# Patient Record
Sex: Female | Born: 1985 | Race: Black or African American | Hispanic: No | Marital: Married | Smoking: Current some day smoker
Health system: Southern US, Community
[De-identification: ages and names within clinical notes are randomized; demographics above are authoritative.]

## PROBLEM LIST (undated history)

## (undated) DIAGNOSIS — R87629 Unspecified abnormal cytological findings in specimens from vagina: Secondary | ICD-10-CM

## (undated) DIAGNOSIS — F32A Depression, unspecified: Secondary | ICD-10-CM

## (undated) HISTORY — DX: Unspecified abnormal cytological findings in specimens from vagina: R87.629

## (undated) HISTORY — DX: Depression, unspecified: F32.A

---

## 2019-08-14 ENCOUNTER — Encounter (HOSPITAL_COMMUNITY): Payer: Self-pay | Admitting: Emergency Medicine

## 2019-08-14 ENCOUNTER — Inpatient Hospital Stay (HOSPITAL_COMMUNITY): Payer: Medicaid Other

## 2019-08-14 ENCOUNTER — Other Ambulatory Visit: Payer: Self-pay

## 2019-08-14 ENCOUNTER — Inpatient Hospital Stay (HOSPITAL_COMMUNITY)
Admission: EM | Admit: 2019-08-14 | Discharge: 2019-08-14 | Disposition: A | Discharge status: Home / Self Care | Payer: Medicaid Other | Attending: Obstetrics and Gynecology | Admitting: Obstetrics and Gynecology

## 2019-08-14 DIAGNOSIS — W19XXXA Unspecified fall, initial encounter: Secondary | ICD-10-CM | POA: Insufficient documentation

## 2019-08-14 DIAGNOSIS — R197 Diarrhea, unspecified: Secondary | ICD-10-CM | POA: Insufficient documentation

## 2019-08-14 DIAGNOSIS — O26891 Other specified pregnancy related conditions, first trimester: Secondary | ICD-10-CM | POA: Insufficient documentation

## 2019-08-14 DIAGNOSIS — Z3A01 Less than 8 weeks gestation of pregnancy: Secondary | ICD-10-CM | POA: Insufficient documentation

## 2019-08-14 DIAGNOSIS — R519 Headache, unspecified: Secondary | ICD-10-CM | POA: Diagnosis not present

## 2019-08-14 DIAGNOSIS — Z3491 Encounter for supervision of normal pregnancy, unspecified, first trimester: Secondary | ICD-10-CM

## 2019-08-14 DIAGNOSIS — R109 Unspecified abdominal pain: Secondary | ICD-10-CM | POA: Diagnosis not present

## 2019-08-14 DIAGNOSIS — Z3A11 11 weeks gestation of pregnancy: Secondary | ICD-10-CM | POA: Diagnosis not present

## 2019-08-14 LAB — CBC
HCT: 39.6 % (ref 36.0–46.0)
Hemoglobin: 13.1 g/dL (ref 12.0–15.0)
MCH: 29 pg (ref 26.0–34.0)
MCHC: 33.1 g/dL (ref 30.0–36.0)
MCV: 87.8 fL (ref 80.0–100.0)
Platelets: 271 10*3/uL (ref 150–400)
RBC: 4.51 MIL/uL (ref 3.87–5.11)
RDW: 12.6 % (ref 11.5–15.5)
WBC: 10.3 10*3/uL (ref 4.0–10.5)
nRBC: 0 % (ref 0.0–0.2)

## 2019-08-14 LAB — WET PREP, GENITAL
Clue Cells Wet Prep HPF POC: NONE SEEN
Sperm: NONE SEEN
Trich, Wet Prep: NONE SEEN
Yeast Wet Prep HPF POC: NONE SEEN

## 2019-08-14 LAB — URINALYSIS, ROUTINE W REFLEX MICROSCOPIC
Bilirubin Urine: NEGATIVE
Glucose, UA: NEGATIVE mg/dL
Ketones, ur: NEGATIVE mg/dL
Nitrite: NEGATIVE
Protein, ur: 30 mg/dL — AB
Specific Gravity, Urine: 1.027 (ref 1.005–1.030)
pH: 5 (ref 5.0–8.0)

## 2019-08-14 LAB — HCG, QUANTITATIVE, PREGNANCY: hCG, Beta Chain, Quant, S: 90204 m[IU]/mL — ABNORMAL HIGH (ref <5)

## 2019-08-14 LAB — ABO/RH: ABO/RH(D): O POS

## 2019-08-14 LAB — HIV ANTIBODY (ROUTINE TESTING W REFLEX): HIV Screen 4th Generation wRfx: NONREACTIVE

## 2019-08-14 LAB — POC URINE PREG, ED: Preg Test, Ur: POSITIVE — AB

## 2019-08-14 NOTE — MAU Provider Note (Signed)
Chief Complaint: Abdominal Pain and Fall   First Provider Initiated Contact with Patient 08/14/19 1757      SUBJECTIVE HPI: Crystal Esparza is a 34 y.o. G3P2000 at [redacted]w[redacted]d by LMP who presents to maternity admissions reporting she fell on 08/11/19 and hit her abdomen on her car and since then has menstrual-like abdominal cramping.  Today she developed diarrhea and a headache so came to MAU for evaluation.  She recently moved to [place] and does not have a prenatal provider yet.  Location: lower abdomen Quality: cramping Severity: 7/10 on pain scale Duration: 3 days Timing: intermittent Modifying factors: none Associated signs and symptoms: headache and diarrhea  HPI  History reviewed. No pertinent past medical history. History reviewed. No pertinent surgical history. Social History   Socioeconomic History  . Marital status: Married    Spouse name: Not on file  . Number of children: Not on file  . Years of education: Not on file  . Highest education level: Not on file  Occupational History  . Not on file  Tobacco Use  . Smoking status: Not on file  Substance and Sexual Activity  . Alcohol use: Not on file  . Drug use: Not on file  . Sexual activity: Not on file  Other Topics Concern  . Not on file  Social History Narrative  . Not on file   Social Determinants of Health   Financial Resource Strain:   . Difficulty of Paying Living Expenses:   Food Insecurity:   . Worried About Programme researcher, broadcasting/film/video in the Last Year:   . Barista in the Last Year:   Transportation Needs:   . Freight forwarder (Medical):   Marland Kitchen Lack of Transportation (Non-Medical):   Physical Activity:   . Days of Exercise per Week:   . Minutes of Exercise per Session:   Stress:   . Feeling of Stress :   Social Connections:   . Frequency of Communication with Friends and Family:   . Frequency of Social Gatherings with Friends and Family:   . Attends Religious Services:   . Active Member of  Clubs or Organizations:   . Attends Banker Meetings:   Marland Kitchen Marital Status:   Intimate Partner Violence:   . Fear of Current or Ex-Partner:   . Emotionally Abused:   Marland Kitchen Physically Abused:   . Sexually Abused:    No current facility-administered medications on file prior to encounter.   No current outpatient medications on file prior to encounter.   No Known Allergies  ROS:  Review of Systems  Constitutional: Negative for chills, fatigue and fever.  Respiratory: Negative for shortness of breath.   Cardiovascular: Negative for chest pain.  Gastrointestinal: Positive for abdominal pain and diarrhea. Negative for nausea and vomiting.  Genitourinary: Negative for difficulty urinating, dysuria, flank pain, pelvic pain, vaginal bleeding, vaginal discharge and vaginal pain.  Neurological: Positive for headaches. Negative for dizziness.  Psychiatric/Behavioral: Negative.      I have reviewed patient's Past Medical Hx, Surgical Hx, Family Hx, Social Hx, medications and allergies.   Physical Exam   Patient Vitals for the past 24 hrs:  BP Temp Temp src Pulse Resp SpO2 Height  08/14/19 1950 112/69 -- -- 66 -- -- --  08/14/19 1724 101/62 98.2 F (36.8 C) Oral 75 15 99 % --  08/14/19 1550 -- -- -- -- -- -- 5' 6.5" (1.689 m)  08/14/19 1549 119/76 98.9 F (37.2 C) Oral 98 14 96 % --  Constitutional: Well-developed, well-nourished female in no acute distress.  Cardiovascular: normal rate Respiratory: normal effort GI: Abd soft, non-tender. Pos BS x 4 MS: Extremities nontender, no edema, normal ROM Neurologic: Alert and oriented x 4.  GU: Neg CVAT.  PELVIC EXAM: Wet prep/GCC collected by blind swab   RN unable to obtain FHT by doppler  LAB RESULTS Results for orders placed or performed during the hospital encounter of 08/14/19 (from the past 24 hour(s))  POC Urine Pregnancy, ED (not at [facility])     Status: Abnormal   Collection Time: 08/14/19  4:18 PM  Result Value Ref  Range   Preg Test, Ur POSITIVE (A) NEGATIVE  CBC     Status: None   Collection Time: 08/14/19  6:05 PM  Result Value Ref Range   WBC 10.3 4.0 - 10.5 K/uL   RBC 4.51 3.87 - 5.11 MIL/uL   Hemoglobin 13.1 12.0 - 15.0 g/dL   HCT 39.6 36 - 46 %   MCV 87.8 80.0 - 100.0 fL   MCH 29.0 26.0 - 34.0 pg   MCHC 33.1 30.0 - 36.0 g/dL   RDW 12.6 11.5 - 15.5 %   Platelets 271 150 - 400 K/uL   nRBC 0.0 0.0 - 0.2 %  hCG, quantitative, pregnancy     Status: Abnormal   Collection Time: 08/14/19  6:05 PM  Result Value Ref Range   hCG, Beta Chain, Quant, S 90,204 (H) <5 mIU/mL  HIV Antibody (routine testing w rflx)     Status: None   Collection Time: 08/14/19  6:05 PM  Result Value Ref Range   HIV Screen 4th Generation wRfx Non Reactive Non Reactive  ABO/Rh     Status: None   Collection Time: 08/14/19  6:05 PM  Result Value Ref Range   ABO/RH(D) O POS    No rh immune globuloin      NOT A RH IMMUNE GLOBULIN CANDIDATE, PT RH POSITIVE Performed at [facility], [place] [address] [place], [place] [address]   Urinalysis, Routine w reflex microscopic     Status: Abnormal   Collection Time: 08/14/19  6:15 PM  Result Value Ref Range   Color, Urine YELLOW YELLOW   APPearance CLOUDY (A) CLEAR   Specific Gravity, Urine 1.027 1.005 - 1.030   pH 5.0 5.0 - 8.0   Glucose, UA NEGATIVE NEGATIVE mg/dL   Hgb urine dipstick MODERATE (A) NEGATIVE   Bilirubin Urine NEGATIVE NEGATIVE   Ketones, ur NEGATIVE NEGATIVE mg/dL   Protein, ur 30 (A) NEGATIVE mg/dL   Nitrite NEGATIVE NEGATIVE   Leukocytes,Ua SMALL (A) NEGATIVE   RBC / HPF 6-10 0 - 5 RBC/hpf   WBC, UA 6-10 0 - 5 WBC/hpf   Bacteria, UA RARE (A) NONE SEEN   Squamous Epithelial / LPF 21-50 0 - 5   Mucus PRESENT   Wet prep, genital     Status: Abnormal   Collection Time: 08/14/19  6:20 PM   Specimen: Vaginal  Result Value Ref Range   Yeast Wet Prep HPF POC NONE SEEN NONE SEEN   Trich, Wet Prep NONE SEEN NONE SEEN   Clue Cells Wet Prep HPF POC NONE  SEEN NONE SEEN   WBC, Wet Prep HPF POC FEW (A) NONE SEEN   Sperm NONE SEEN     --/--/O POS (06/20 1805)  IMAGING US OB LESS THAN 14 WEEKS WITH OB TRANSVAGINAL  Result Date: 08/14/2019 CLINICAL DATA:  Abdominal pain. EXAM: OBSTETRIC <14 WK [place] AND TRANSVAGINAL  OB US TECHNIQUE: Both transabdominal and transvaginal ultrasound examinations were performed for complete evaluation of the gestation as well as the maternal uterus, adnexal regions, and pelvic cul-de-sac. Transvaginal technique was performed to assess early pregnancy. COMPARISON:  None. FINDINGS: Intrauterine gestational sac: Single Yolk sac:  Visualized. Embryo:  Visualized. Cardiac Activity: Visualized. Heart Rate: 163 bpm MSD: 22.2 mm   7 w   1 d CRL:  15.3 mm   7 w   6 d                  [place] [facility]: March 26, 2020 Subchorionic hemorrhage:  None visualized. Maternal uterus/adnexae: The right ovary measures 4.2 cm x 3.7 cm x 3.6 cm and is normal in appearance. The left ovary measures 3.8 cm x 3.0 cm x 2.3 cm and is normal in appearance. A trace amount of pelvic free fluid is seen. IMPRESSION: Single, viable intrauterine pregnancy at approximately 7 weeks and 6 days gestation by ultrasound evaluation. Electronically Signed   By: Aram Candela M.D.   On: 08/14/2019 19:13    MAU Management/MDM: Orders Placed This Encounter  Procedures  . Wet prep, genital  . OB Urine Culture  . US OB LESS THAN 14 WEEKS WITH OB TRANSVAGINAL  . Urinalysis, Routine w reflex microscopic  . CBC  . hCG, quantitative, pregnancy  . HIV Antibody (routine testing w rflx)  . POC Urine Pregnancy, ED (not at [facility])  . ABO/Rh  . Discharge patient    No orders of the defined types were placed in this encounter.   IUP on today's [place] with dates inconsistent with LMP dates.  No acute abdomen, no lab findings to explain abdominal pain. Given GI symptoms, pain may be related to GI upset, either viral or food-borne. Message sent to [facility] to change new OB appt to later  due to change in EDD.  Pt to take Tylenol, try caffeine for headaches.  Rest, heat, ice, warm bath for abdominal pain.  Return to MAU with worsening symptoms.     ASSESSMENT 1. Normal IUP (intrauterine pregnancy) on prenatal ultrasound, first trimester   2. Abdominal pain during pregnancy in first trimester     PLAN Discharge home  Allergies as of 08/14/2019   No Known Allergies     Medication List    You have not been prescribed any medications.     Follow-up Information    [facility] Follow up.   Why: For early prenatal care as planned. Return to MAU as needed for emergencies.  Contact information: [address] [place] [address] [phone]              Sharen Counter Certified Nurse-Midwife 08/14/2019  7:56 PM

## 2019-08-14 NOTE — ED Provider Notes (Signed)
MSE was initiated and I personally evaluated the patient and placed orders (if any) at  4:14 PM on August 14, 2019.   Crystal Esparza is a 34 y.o. female, presenting to the ED with right-sided abdominal pain and back pain for the past few days. Patient states a few days ago, she tripped and fell into her car, striking the front of her abdomen.  She did not initially feel pain or discomfort, however, later that day developed discomfort to the right abdomen and right lower back.  She describes this pain as a persistent soreness, mild to moderate, nonradiating. A couple days ago, she began to have intermittent, throbbing headaches.  She has not taken any medications for her symptoms.  Her headache is better today.  She denies head injury, syncope, LOC, neurologic deficits, vaginal bleeding, vomiting, recent illness, fever.  She states she recently moved to the area from [facility].  She has not yet established a local OB/GYN. She was told by [facility] that they estimated her to be about [redacted] weeks pregnant.  LMP March 30.   Physical Exam:  No diaphoresis.  No pallor.  Pulmonary: No increased work of breathing.  Speaks in full sentences without difficulty. No tachypnea.   Cardiac: Normal rate and regular. Peripheral pulses intact.  Abdominal: Some tenderness to the right side of the abdomen and right flank without color change or bruising.  No peritoneal signs.  No rebound tenderness.  No guarding.    Neurologic: Appropriate sensation and motor function intact in each of the extremities.  Ambulatory without noted difficulty.  MSK: Tenderness in the area of the right hip, however, patient has range of motion in the right hip without much pain and without noted difficulty. Tenderness to the right lower back without swelling or bruising.   4:21 PM: Spoke with Aurther Loft, CNM at MAU.  Discussed patient's complaint, symptoms, and physical exam findings.  States patient can come to the  MAU.  The patient appears stable so that the remainder of the MSE may be completed by another provider.    Vitals:   08/14/19 1549 08/14/19 1550  BP: 119/76   Pulse: 98   Resp: 14   Temp: 98.9 F (37.2 C)   TempSrc: Oral   SpO2: 96%   Height:  5' 6.5" (1.689 m)    Abnormal Labs Reviewed  POC URINE PREG, ED - Abnormal; Notable for the following components:      Result Value   Preg Test, Ur POSITIVE (*)    All other components within normal limits     Concepcion Living 08/14/19 1648    Terrilee Files, MD 08/15/19 1153

## 2019-08-14 NOTE — MAU Note (Addendum)
Crystal Esparza is a 34 y.o. at [redacted]w[redacted]d here in MAU reporting:  +right sided abdominal A few days ago she fell and hit her abdomen entering the car. Had no pain initially; it has slowly progressed. Is intermittent. Sharp; like a period.  Denies vaginal discharge or bleeding Pain score: 7/10 Has not tried anything for the pain. Transferred to mau from the ED.  Patient expressed that she just moved here and does not have an OB. Vitals:   08/14/19 1549 08/14/19 1724  BP: 119/76 101/62  Pulse: 98 75  Resp: 14 15  Temp: 98.9 F (37.2 C) 98.2 F (36.8 C)  SpO2: 96% 99%   FHR: attempted with doppler; patient reports she has not had an ultrasound yet. [facility] gave EDD based on last reported period. Lab orders placed from triage: ua

## 2019-08-15 LAB — GC/CHLAMYDIA PROBE AMP (~~LOC~~) NOT AT ARMC
Chlamydia: NEGATIVE
Comment: NEGATIVE
Comment: NORMAL
Neisseria Gonorrhea: NEGATIVE

## 2019-08-16 LAB — CULTURE, OB URINE: Culture: >=100000 — AB

## 2019-08-23 ENCOUNTER — Ambulatory Visit: Payer: Medicaid Other

## 2019-08-24 ENCOUNTER — Encounter: Payer: Self-pay | Admitting: Obstetrics

## 2019-09-05 ENCOUNTER — Ambulatory Visit (INDEPENDENT_AMBULATORY_CARE_PROVIDER_SITE_OTHER): Payer: Medicaid Other

## 2019-09-05 DIAGNOSIS — Z3490 Encounter for supervision of normal pregnancy, unspecified, unspecified trimester: Secondary | ICD-10-CM | POA: Insufficient documentation

## 2019-09-05 DIAGNOSIS — Z3480 Encounter for supervision of other normal pregnancy, unspecified trimester: Secondary | ICD-10-CM

## 2019-09-05 DIAGNOSIS — Z3A00 Weeks of gestation of pregnancy not specified: Secondary | ICD-10-CM

## 2019-09-05 MED ORDER — BLOOD PRESSURE KIT DEVI
1.0000 | 0 refills | Status: AC
Start: 2019-09-05 — End: ?

## 2019-09-05 NOTE — Progress Notes (Signed)
I connected with  Crystal Esparza on 09/05/19 by a video enabled telemedicine application and verified that I am speaking with the correct person using two identifiers.   I discussed the limitations of evaluation and management by telemedicine. The patient expressed understanding and agreed to proceed.   PRENATAL INTAKE SUMMARY  Crystal Esparza presents today New OB Nurse Interview.  OB History    Gravida  3   Para  2   Term  2   Preterm      AB      Living  2     SAB      TAB      Ectopic      Multiple      Live Births             I have reviewed the patient's medical, obstetrical, social, and family histories, medications, and available lab results.  SUBJECTIVE She has no unusual complaints and complains of fatigue, diarrhea, NV.  OBJECTIVE Initial Nurse Intake (New OB)  GENERAL APPEARANCE: oriented to person, place and time   ASSESSMENT PHQ-9=10 Normal pregnancy Fatigue, diarrhea, NV  PLAN Prenatal care OB labs will be done at NOB visit BP Cuff sent, patient to pick and take to NOB Appt. Babyscripts App downloaded

## 2019-09-05 NOTE — Progress Notes (Signed)
Patient was assessed and managed by nursing staff during this encounter. I have reviewed the chart and agree with the documentation and plan. I have also made any necessary editorial changes.  Jaynie Collins, MD 09/05/2019 9:54 AM

## 2019-09-12 ENCOUNTER — Encounter: Payer: Self-pay | Admitting: Obstetrics

## 2019-09-12 ENCOUNTER — Other Ambulatory Visit: Payer: Self-pay

## 2019-09-12 ENCOUNTER — Encounter: Payer: Self-pay | Admitting: Obstetrics and Gynecology

## 2019-09-12 ENCOUNTER — Other Ambulatory Visit (HOSPITAL_COMMUNITY)
Admission: RE | Admit: 2019-09-12 | Discharge: 2019-09-12 | Disposition: A | Discharge status: Home / Self Care | Payer: Medicaid Other | Source: Ambulatory Visit | Attending: Obstetrics and Gynecology | Admitting: Obstetrics and Gynecology

## 2019-09-12 ENCOUNTER — Ambulatory Visit (INDEPENDENT_AMBULATORY_CARE_PROVIDER_SITE_OTHER): Payer: Medicaid Other | Admitting: Obstetrics and Gynecology

## 2019-09-12 DIAGNOSIS — O99210 Obesity complicating pregnancy, unspecified trimester: Secondary | ICD-10-CM | POA: Insufficient documentation

## 2019-09-12 DIAGNOSIS — Z3490 Encounter for supervision of normal pregnancy, unspecified, unspecified trimester: Secondary | ICD-10-CM | POA: Insufficient documentation

## 2019-09-12 DIAGNOSIS — E669 Obesity, unspecified: Secondary | ICD-10-CM | POA: Diagnosis not present

## 2019-09-12 DIAGNOSIS — O99211 Obesity complicating pregnancy, first trimester: Secondary | ICD-10-CM | POA: Diagnosis not present

## 2019-09-12 DIAGNOSIS — Z3A12 12 weeks gestation of pregnancy: Secondary | ICD-10-CM | POA: Diagnosis not present

## 2019-09-12 DIAGNOSIS — O34219 Maternal care for unspecified type scar from previous cesarean delivery: Secondary | ICD-10-CM

## 2019-09-12 MED ORDER — ASPIRIN EC 81 MG PO TBEC: 81.0000 mg | DELAYED_RELEASE_TABLET | Freq: Every day | ORAL | 2 refills | Status: AC

## 2019-09-12 MED ORDER — VITAFOL GUMMIES 3.33-0.333-34.8 MG PO CHEW: 2.0000 | CHEWABLE_TABLET | Freq: Every day | ORAL | 6 refills | Status: AC

## 2019-09-12 NOTE — Patient Instructions (Signed)
 First Trimester of Pregnancy The first trimester of pregnancy is from week 1 until the end of week 13 (months 1 through 3). A week after a sperm fertilizes an egg, the egg will implant on the wall of the uterus. This embryo will begin to develop into a baby. Genes from you and your partner will form the baby. The female genes will determine whether the baby will be a boy or a girl. At 6-8 weeks, the eyes and face will be formed, and the heartbeat can be seen on ultrasound. At the end of 12 weeks, all the baby's organs will be formed. Now that you are pregnant, you will want to do everything you can to have a healthy baby. Two of the most important things are to get good prenatal care and to follow your health care provider's instructions. Prenatal care is all the medical care you receive before the baby's birth. This care will help prevent, find, and treat any problems during the pregnancy and childbirth. Body changes during your first trimester Your body goes through many changes during pregnancy. The changes vary from woman to woman.  You may gain or lose a couple of pounds at first.  You may feel sick to your stomach (nauseous) and you may throw up (vomit). If the vomiting is uncontrollable, call your health care provider.  You may tire easily.  You may develop headaches that can be relieved by medicines. All medicines should be approved by your health care provider.  You may urinate more often. Painful urination may mean you have a bladder infection.  You may develop heartburn as a result of your pregnancy.  You may develop constipation because certain hormones are causing the muscles that push stool through your intestines to slow down.  You may develop hemorrhoids or swollen veins (varicose veins).  Your breasts may begin to grow larger and become tender. Your nipples may stick out more, and the tissue that surrounds them (areola) may become darker.  Your gums may bleed and may be  sensitive to brushing and flossing.  Dark spots or blotches (chloasma, mask of pregnancy) may develop on your face. This will likely fade after the baby is born.  Your menstrual periods will stop.  You may have a loss of appetite.  You may develop cravings for certain kinds of food.  You may have changes in your emotions from day to day, such as being excited to be pregnant or being concerned that something may go wrong with the pregnancy and baby.  You may have more vivid and strange dreams.  You may have changes in your hair. These can include thickening of your hair, rapid growth, and changes in texture. Some women also have hair loss during or after pregnancy, or hair that feels dry or thin. Your hair will most likely return to normal after your baby is born. What to expect at prenatal visits During a routine prenatal visit:  You will be weighed to make sure you and the baby are growing normally.  Your blood pressure will be taken.  Your abdomen will be measured to track your baby's growth.  The fetal heartbeat will be listened to between weeks 10 and 14 of your pregnancy.  Test results from any previous visits will be discussed. Your health care provider may ask you:  How you are feeling.  If you are feeling the baby move.  If you have had any abnormal symptoms, such as leaking fluid, bleeding, severe headaches, or   abdominal cramping.  If you are using any tobacco products, including cigarettes, chewing tobacco, and electronic cigarettes.  If you have any questions. Other tests that may be performed during your first trimester include:  Blood tests to find your blood type and to check for the presence of any previous infections. The tests will also be used to check for low iron levels (anemia) and protein on red blood cells (Rh antibodies). Depending on your risk factors, or if you previously had diabetes during pregnancy, you may have tests to check for high blood sugar  that affects pregnant women (gestational diabetes).  Urine tests to check for infections, diabetes, or protein in the urine.  An ultrasound to confirm the proper growth and development of the baby.  Fetal screens for spinal cord problems (spina bifida) and Down syndrome.  HIV (human immunodeficiency virus) testing. Routine prenatal testing includes screening for HIV, unless you choose not to have this test.  You may need other tests to make sure you and the baby are doing well. Follow these instructions at home: Medicines  Follow your health care provider's instructions regarding medicine use. Specific medicines may be either safe or unsafe to take during pregnancy.  Take a prenatal vitamin that contains at least 600 micrograms (mcg) of folic acid.  If you develop constipation, try taking a stool softener if your health care provider approves. Eating and drinking   Eat a balanced diet that includes fresh fruits and vegetables, whole grains, good sources of protein such as meat, eggs, or tofu, and low-fat dairy. Your health care provider will help you determine the amount of weight gain that is right for you.  Avoid raw meat and uncooked cheese. These carry germs that can cause birth defects in the baby.  Eating four or five small meals rather than three large meals a day may help relieve nausea and vomiting. If you start to feel nauseous, eating a few soda crackers can be helpful. Drinking liquids between meals, instead of during meals, also seems to help ease nausea and vomiting.  Limit foods that are high in fat and processed sugars, such as fried and sweet foods.  To prevent constipation: ? Eat foods that are high in fiber, such as fresh fruits and vegetables, whole grains, and beans. ? Drink enough fluid to keep your urine clear or pale yellow. Activity  Exercise only as directed by your health care provider. Most women can continue their usual exercise routine during  pregnancy. Try to exercise for 30 minutes at least 5 days a week. Exercising will help you: ? Control your weight. ? Stay in shape. ? Be prepared for labor and delivery.  Experiencing pain or cramping in the lower abdomen or lower back is a good sign that you should stop exercising. Check with your health care provider before continuing with normal exercises.  Try to avoid standing for long periods of time. Move your legs often if you must stand in one place for a long time.  Avoid heavy lifting.  Wear low-heeled shoes and practice good posture.  You may continue to have sex unless your health care provider tells you not to. Relieving pain and discomfort  Wear a good support bra to relieve breast tenderness.  Take warm sitz baths to soothe any pain or discomfort caused by hemorrhoids. Use hemorrhoid cream if your health care provider approves.  Rest with your legs elevated if you have leg cramps or low back pain.  If you develop varicose veins   in your legs, wear support hose. Elevate your feet for 15 minutes, 3-4 times a day. Limit salt in your diet. Prenatal care  Schedule your prenatal visits by the twelfth week of pregnancy. They are usually scheduled monthly at first, then more often in the last 2 months before delivery.  Write down your questions. Take them to your prenatal visits.  Keep all your prenatal visits as told by your health care provider. This is important. Safety  Wear your seat belt at all times when driving.  Make a list of emergency phone numbers, including numbers for family, friends, the hospital, and police and fire departments. General instructions  Ask your health care provider for a referral to a local prenatal education class. Begin classes no later than the beginning of month 6 of your pregnancy.  Ask for help if you have counseling or nutritional needs during pregnancy. Your health care provider can offer advice or refer you to specialists for help  with various needs.  Do not use hot tubs, steam rooms, or saunas.  Do not douche or use tampons or scented sanitary pads.  Do not cross your legs for long periods of time.  Avoid cat litter boxes and soil used by cats. These carry germs that can cause birth defects in the baby and possibly loss of the fetus by miscarriage or stillbirth.  Avoid all smoking, herbs, alcohol, and medicines not prescribed by your health care provider. Chemicals in these products affect the formation and growth of the baby.  Do not use any products that contain nicotine or tobacco, such as cigarettes and e-cigarettes. If you need help quitting, ask your health care provider. You may receive counseling support and other resources to help you quit.  Schedule a dentist appointment. At home, brush your teeth with a soft toothbrush and be gentle when you floss. Contact a health care provider if:  You have dizziness.  You have mild pelvic cramps, pelvic pressure, or nagging pain in the abdominal area.  You have persistent nausea, vomiting, or diarrhea.  You have a bad smelling vaginal discharge.  You have pain when you urinate.  You notice increased swelling in your face, hands, legs, or ankles.  You are exposed to fifth disease or chickenpox.  You are exposed to German measles (rubella) and have never had it. Get help right away if:  You have a fever.  You are leaking fluid from your vagina.  You have spotting or bleeding from your vagina.  You have severe abdominal cramping or pain.  You have rapid weight gain or loss.  You vomit blood or material that looks like coffee grounds.  You develop a severe headache.  You have shortness of breath.  You have any kind of trauma, such as from a fall or a car accident. Summary  The first trimester of pregnancy is from week 1 until the end of week 13 (months 1 through 3).  Your body goes through many changes during pregnancy. The changes vary from  woman to woman.  You will have routine prenatal visits. During those visits, your health care provider will examine you, discuss any test results you may have, and talk with you about how you are feeling. This information is not intended to replace advice given to you by your health care provider. Make sure you discuss any questions you have with your health care provider. Document Revised: 01/23/2017 Document Reviewed: 01/23/2016 [employer] Patient Education  2020 [employer]   Second Trimester of   Pregnancy The second trimester is from week 14 through week 27 (months 4 through 6). The second trimester is often a time when you feel your best. Your body has adjusted to being pregnant, and you begin to feel better physically. Usually, morning sickness has lessened or quit completely, you may have more energy, and you may have an increase in appetite. The second trimester is also a time when the fetus is growing rapidly. At the end of the sixth month, the fetus is about 9 inches long and weighs about 1 pounds. You will likely begin to feel the baby move (quickening) between 16 and 20 weeks of pregnancy. Body changes during your second trimester Your body continues to go through many changes during your second trimester. The changes vary from woman to woman.  Your weight will continue to increase. You will notice your lower abdomen bulging out.  You may begin to get stretch marks on your hips, abdomen, and breasts.  You may develop headaches that can be relieved by medicines. The medicines should be approved by your health care provider.  You may urinate more often because the fetus is pressing on your bladder.  You may develop or continue to have heartburn as a result of your pregnancy.  You may develop constipation because certain hormones are causing the muscles that push waste through your intestines to slow down.  You may develop hemorrhoids or swollen, bulging veins (varicose  veins).  You may have back pain. This is caused by: ? Weight gain. ? Pregnancy hormones that are relaxing the joints in your pelvis. ? A shift in weight and the muscles that support your balance.  Your breasts will continue to grow and they will continue to become tender.  Your gums may bleed and may be sensitive to brushing and flossing.  Dark spots or blotches (chloasma, mask of pregnancy) may develop on your face. This will likely fade after the baby is born.  A dark line from your belly button to the pubic area (linea nigra) may appear. This will likely fade after the baby is born.  You may have changes in your hair. These can include thickening of your hair, rapid growth, and changes in texture. Some women also have hair loss during or after pregnancy, or hair that feels dry or thin. Your hair will most likely return to normal after your baby is born. What to expect at prenatal visits During a routine prenatal visit:  You will be weighed to make sure you and the fetus are growing normally.  Your blood pressure will be taken.  Your abdomen will be measured to track your baby's growth.  The fetal heartbeat will be listened to.  Any test results from the previous visit will be discussed. Your health care provider may ask you:  How you are feeling.  If you are feeling the baby move.  If you have had any abnormal symptoms, such as leaking fluid, bleeding, severe headaches, or abdominal cramping.  If you are using any tobacco products, including cigarettes, chewing tobacco, and electronic cigarettes.  If you have any questions. Other tests that may be performed during your second trimester include:  Blood tests that check for: ? Low iron levels (anemia). ? High blood sugar that affects pregnant women (gestational diabetes) between 24 and 28 weeks. ? Rh antibodies. This is to check for a protein on red blood cells (Rh factor).  Urine tests to check for infections,  diabetes, or protein in the urine.    An ultrasound to confirm the proper growth and development of the baby.  An amniocentesis to check for possible genetic problems.  Fetal screens for spina bifida and Down syndrome.  HIV (human immunodeficiency virus) testing. Routine prenatal testing includes screening for HIV, unless you choose not to have this test. Follow these instructions at home: Medicines  Follow your health care provider's instructions regarding medicine use. Specific medicines may be either safe or unsafe to take during pregnancy.  Take a prenatal vitamin that contains at least 600 micrograms (mcg) of folic acid.  If you develop constipation, try taking a stool softener if your health care provider approves. Eating and drinking   Eat a balanced diet that includes fresh fruits and vegetables, whole grains, good sources of protein such as meat, eggs, or tofu, and low-fat dairy. Your health care provider will help you determine the amount of weight gain that is right for you.  Avoid raw meat and uncooked cheese. These carry germs that can cause birth defects in the baby.  If you have low calcium intake from food, talk to your health care provider about whether you should take a daily calcium supplement.  Limit foods that are high in fat and processed sugars, such as fried and sweet foods.  To prevent constipation: ? Drink enough fluid to keep your urine clear or pale yellow. ? Eat foods that are high in fiber, such as fresh fruits and vegetables, whole grains, and beans. Activity  Exercise only as directed by your health care provider. Most women can continue their usual exercise routine during pregnancy. Try to exercise for 30 minutes at least 5 days a week. Stop exercising if you experience uterine contractions.  Avoid heavy lifting, wear low heel shoes, and practice good posture.  A sexual relationship may be continued unless your health care provider directs you  otherwise. Relieving pain and discomfort  Wear a good support bra to prevent discomfort from breast tenderness.  Take warm sitz baths to soothe any pain or discomfort caused by hemorrhoids. Use hemorrhoid cream if your health care provider approves.  Rest with your legs elevated if you have leg cramps or low back pain.  If you develop varicose veins, wear support hose. Elevate your feet for 15 minutes, 3-4 times a day. Limit salt in your diet. Prenatal Care  Write down your questions. Take them to your prenatal visits.  Keep all your prenatal visits as told by your health care provider. This is important. Safety  Wear your seat belt at all times when driving.  Make a list of emergency phone numbers, including numbers for family, friends, the hospital, and police and fire departments. General instructions  Ask your health care provider for a referral to a local prenatal education class. Begin classes no later than the beginning of month 6 of your pregnancy.  Ask for help if you have counseling or nutritional needs during pregnancy. Your health care provider can offer advice or refer you to specialists for help with various needs.  Do not use hot tubs, steam rooms, or saunas.  Do not douche or use tampons or scented sanitary pads.  Do not cross your legs for long periods of time.  Avoid cat litter boxes and soil used by cats. These carry germs that can cause birth defects in the baby and possibly loss of the fetus by miscarriage or stillbirth.  Avoid all smoking, herbs, alcohol, and unprescribed drugs. Chemicals in these products can affect the formation and growth of   the baby.  Do not use any products that contain nicotine or tobacco, such as cigarettes and e-cigarettes. If you need help quitting, ask your health care provider.  Visit your dentist if you have not gone yet during your pregnancy. Use a soft toothbrush to brush your teeth and be gentle when you floss. Contact a  health care provider if:  You have dizziness.  You have mild pelvic cramps, pelvic pressure, or nagging pain in the abdominal area.  You have persistent nausea, vomiting, or diarrhea.  You have a bad smelling vaginal discharge.  You have pain when you urinate. Get help right away if:  You have a fever.  You are leaking fluid from your vagina.  You have spotting or bleeding from your vagina.  You have severe abdominal cramping or pain.  You have rapid weight gain or weight loss.  You have shortness of breath with chest pain.  You notice sudden or extreme swelling of your face, hands, ankles, feet, or legs.  You have not felt your baby move in over an hour.  You have severe headaches that do not go away when you take medicine.  You have vision changes. Summary  The second trimester is from week 14 through week 27 (months 4 through 6). It is also a time when the fetus is growing rapidly.  Your body goes through many changes during pregnancy. The changes vary from woman to woman.  Avoid all smoking, herbs, alcohol, and unprescribed drugs. These chemicals affect the formation and growth your baby.  Do not use any tobacco products, such as cigarettes, chewing tobacco, and e-cigarettes. If you need help quitting, ask your health care provider.  Contact your health care provider if you have any questions. Keep all prenatal visits as told by your health care provider. This is important. This information is not intended to replace advice given to you by your health care provider. Make sure you discuss any questions you have with your health care provider. Document Revised: 06/04/2018 Document Reviewed: 03/18/2016 [employer] Patient Education  2020 [employer]   Contraception Choices Contraception, also called birth control, refers to methods or devices that prevent pregnancy. Hormonal methods Contraceptive implant  A contraceptive implant is a thin, plastic tube that  contains a hormone. It is inserted into the upper part of the arm. It can remain in place for up to 3 years. Progestin-only injections Progestin-only injections are injections of progestin, a synthetic form of the hormone progesterone. They are given every 3 months by a health care provider. Birth control pills  Birth control pills are pills that contain hormones that prevent pregnancy. They must be taken once a day, preferably at the same time each day. Birth control patch  The birth control patch contains hormones that prevent pregnancy. It is placed on the skin and must be changed once a week for three weeks and removed on the fourth week. A prescription is needed to use this method of contraception. Vaginal ring  A vaginal ring contains hormones that prevent pregnancy. It is placed in the vagina for three weeks and removed on the fourth week. After that, the process is repeated with a new ring. A prescription is needed to use this method of contraception. Emergency contraceptive Emergency contraceptives prevent pregnancy after unprotected sex. They come in pill form and can be taken up to 5 days after sex. They work best the sooner they are taken after having sex. Most emergency contraceptives are available without a prescription. This   method should not be used as your only form of birth control. Barrier methods Female condom  A female condom is a thin sheath that is worn over the penis during sex. Condoms keep sperm from going inside a woman's body. They can be used with a spermicide to increase their effectiveness. They should be disposed after a single use. Female condom  A female condom is a soft, loose-fitting sheath that is put into the vagina before sex. The condom keeps sperm from going inside a woman's body. They should be disposed after a single use. Diaphragm  A diaphragm is a soft, dome-shaped barrier. It is inserted into the vagina before sex, along with a spermicide. The  diaphragm blocks sperm from entering the uterus, and the spermicide kills sperm. A diaphragm should be left in the vagina for 6-8 hours after sex and removed within 24 hours. A diaphragm is prescribed and fitted by a health care provider. A diaphragm should be replaced every 1-2 years, after giving birth, after gaining more than 15 lb (6.8 kg), and after pelvic surgery. Cervical cap  A cervical cap is a round, soft latex or plastic cup that fits over the cervix. It is inserted into the vagina before sex, along with spermicide. It blocks sperm from entering the uterus. The cap should be left in place for 6-8 hours after sex and removed within 48 hours. A cervical cap must be prescribed and fitted by a health care provider. It should be replaced every 2 years. Sponge  A sponge is a soft, circular piece of polyurethane foam with spermicide on it. The sponge helps block sperm from entering the uterus, and the spermicide kills sperm. To use it, you make it wet and then insert it into the vagina. It should be inserted before sex, left in for at least 6 hours after sex, and removed and thrown away within 30 hours. Spermicides Spermicides are chemicals that kill or block sperm from entering the cervix and uterus. They can come as a cream, jelly, suppository, foam, or tablet. A spermicide should be inserted into the vagina with an applicator at least 10-15 minutes before sex to allow time for it to work. The process must be repeated every time you have sex. Spermicides do not require a prescription. Intrauterine contraception Intrauterine device (IUD) An IUD is a T-shaped device that is put in a woman's uterus. There are two types:  Hormone IUD.This type contains progestin, a synthetic form of the hormone progesterone. This type can stay in place for 3-5 years.  Copper IUD.This type is wrapped in copper wire. It can stay in place for 10 years.  Permanent methods of contraception Female tubal ligation In  this method, a woman's fallopian tubes are sealed, tied, or blocked during surgery to prevent eggs from traveling to the uterus. Hysteroscopic sterilization In this method, a small, flexible insert is placed into each fallopian tube. The inserts cause scar tissue to form in the fallopian tubes and block them, so sperm cannot reach an egg. The procedure takes about 3 months to be effective. Another form of birth control must be used during those 3 months. Female sterilization This is a procedure to tie off the tubes that carry sperm (vasectomy). After the procedure, the man can still ejaculate fluid (semen). Natural planning methods Natural family planning In this method, a couple does not have sex on days when the woman could become pregnant. Calendar method This means keeping track of the length of each menstrual cycle,   identifying the days when pregnancy can happen, and not having sex on those days. Ovulation method In this method, a couple avoids sex during ovulation. Symptothermal method This method involves not having sex during ovulation. The woman typically checks for ovulation by watching changes in her temperature and in the consistency of cervical mucus. Post-ovulation method In this method, a couple waits to have sex until after ovulation. Summary  Contraception, also called birth control, means methods or devices that prevent pregnancy.  Hormonal methods of contraception include implants, injections, pills, patches, vaginal rings, and emergency contraceptives.  Barrier methods of contraception can include female condoms, female condoms, diaphragms, cervical caps, sponges, and spermicides.  There are two types of IUDs (intrauterine devices). An IUD can be put in a woman's uterus to prevent pregnancy for 3-5 years.  Permanent sterilization can be done through a procedure for males, females, or both.  Natural family planning methods involve not having sex on days when the woman could  become pregnant. This information is not intended to replace advice given to you by your health care provider. Make sure you discuss any questions you have with your health care provider. Document Revised: 02/12/2017 Document Reviewed: 03/15/2016 Elsevier Patient Education  2020 [employer]   Breastfeeding  Choosing to breastfeed is one of the best decisions you can make for yourself and your baby. A change in hormones during pregnancy causes your breasts to make breast milk in your milk-producing glands. Hormones prevent breast milk from being released before your baby is born. They also prompt milk flow after birth. Once breastfeeding has begun, thoughts of your baby, as well as his or her sucking or crying, can stimulate the release of milk from your milk-producing glands. Benefits of breastfeeding Research shows that breastfeeding offers many health benefits for infants and mothers. It also offers a cost-free and convenient way to feed your baby. For your baby  Your first milk (colostrum) helps your baby's digestive system to function better.  Special cells in your milk (antibodies) help your baby to fight off infections.  Breastfed babies are less likely to develop asthma, allergies, obesity, or type 2 diabetes. They are also at lower risk for sudden infant death syndrome (SIDS).  Nutrients in breast milk are better able to meet your baby's needs compared to infant formula.  Breast milk improves your baby's brain development. For you  Breastfeeding helps to create a very special bond between you and your baby.  Breastfeeding is convenient. Breast milk costs nothing and is always available at the correct temperature.  Breastfeeding helps to burn calories. It helps you to lose the weight that you gained during pregnancy.  Breastfeeding makes your uterus return faster to its size before pregnancy. It also slows bleeding (lochia) after you give birth.  Breastfeeding helps to lower  your risk of developing type 2 diabetes, osteoporosis, rheumatoid arthritis, cardiovascular disease, and breast, ovarian, uterine, and endometrial cancer later in life. Breastfeeding basics Starting breastfeeding  Find a comfortable place to sit or lie down, with your neck and back well-supported.  Place a pillow or a rolled-up blanket under your baby to bring him or her to the level of your breast (if you are seated). Nursing pillows are specially designed to help support your arms and your baby while you breastfeed.  Make sure that your baby's tummy (abdomen) is facing your abdomen.  Gently massage your breast. With your fingertips, massage from the outer edges of your breast inward toward the nipple. This encourages   milk flow. If your milk flows slowly, you may need to continue this action during the feeding.  Support your breast with 4 fingers underneath and your thumb above your nipple (make the letter "C" with your hand). Make sure your fingers are well away from your nipple and your baby's mouth.  Stroke your baby's lips gently with your finger or nipple.  When your baby's mouth is open wide enough, quickly bring your baby to your breast, placing your entire nipple and as much of the areola as possible into your baby's mouth. The areola is the colored area around your nipple. ? More areola should be visible above your baby's upper lip than below the lower lip. ? Your baby's lips should be opened and extended outward (flanged) to ensure an adequate, comfortable latch. ? Your baby's tongue should be between his or her lower gum and your breast.  Make sure that your baby's mouth is correctly positioned around your nipple (latched). Your baby's lips should create a seal on your breast and be turned out (everted).  It is common for your baby to suck about 2-3 minutes in order to start the flow of breast milk. Latching Teaching your baby how to latch onto your breast properly is very  important. An improper latch can cause nipple pain, decreased milk supply, and poor weight gain in your baby. Also, if your baby is not latched onto your nipple properly, he or she may swallow some air during feeding. This can make your baby fussy. Burping your baby when you switch breasts during the feeding can help to get rid of the air. However, teaching your baby to latch on properly is still the best way to prevent fussiness from swallowing air while breastfeeding. Signs that your baby has successfully latched onto your nipple  Silent tugging or silent sucking, without causing you pain. Infant's lips should be extended outward (flanged).  Swallowing heard between every 3-4 sucks once your milk has started to flow (after your let-down milk reflex occurs).  Muscle movement above and in front of his or her ears while sucking. Signs that your baby has not successfully latched onto your nipple  Sucking sounds or smacking sounds from your baby while breastfeeding.  Nipple pain. If you think your baby has not latched on correctly, slip your finger into the corner of your baby's mouth to break the suction and place it between your baby's gums. Attempt to start breastfeeding again. Signs of successful breastfeeding Signs from your baby  Your baby will gradually decrease the number of sucks or will completely stop sucking.  Your baby will fall asleep.  Your baby's body will relax.  Your baby will retain a small amount of milk in his or her mouth.  Your baby will let go of your breast by himself or herself. Signs from you  Breasts that have increased in firmness, weight, and size 1-3 hours after feeding.  Breasts that are softer immediately after breastfeeding.  Increased milk volume, as well as a change in milk consistency and color by the fifth day of breastfeeding.  Nipples that are not sore, cracked, or bleeding. Signs that your baby is getting enough milk  Wetting at least 1-2  diapers during the first 24 hours after birth.  Wetting at least 5-6 diapers every 24 hours for the first week after birth. The urine should be clear or pale yellow by the age of 5 days.  Wetting 6-8 diapers every 24 hours as your baby continues   to grow and develop.  At least 3 stools in a 24-hour period by the age of 5 days. The stool should be soft and yellow.  At least 3 stools in a 24-hour period by the age of 7 days. The stool should be seedy and yellow.  No loss of weight greater than 10% of birth weight during the first 3 days of life.  Average weight gain of 4-7 oz (113-198 g) per week after the age of 4 days.  Consistent daily weight gain by the age of 5 days, without weight loss after the age of 2 weeks. After a feeding, your baby may spit up a small amount of milk. This is normal. Breastfeeding frequency and duration Frequent feeding will help you make more milk and can prevent sore nipples and extremely full breasts (breast engorgement). Breastfeed when you feel the need to reduce the fullness of your breasts or when your baby shows signs of hunger. This is called "breastfeeding on demand." Signs that your baby is hungry include:  Increased alertness, activity, or restlessness.  Movement of the head from side to side.  Opening of the mouth when the corner of the mouth or cheek is stroked (rooting).  Increased sucking sounds, smacking lips, cooing, sighing, or squeaking.  Hand-to-mouth movements and sucking on fingers or hands.  Fussing or crying. Avoid introducing a pacifier to your baby in the first 4-6 weeks after your baby is born. After this time, you may choose to use a pacifier. Research has shown that pacifier use during the first year of a baby's life decreases the risk of sudden infant death syndrome (SIDS). Allow your baby to feed on each breast as long as he or she wants. When your baby unlatches or falls asleep while feeding from the first breast, offer the  second breast. Because newborns are often sleepy in the first few weeks of life, you may need to awaken your baby to get him or her to feed. Breastfeeding times will vary from baby to baby. However, the following rules can serve as a guide to help you make sure that your baby is properly fed:  Newborns (babies 4 weeks of age or younger) may breastfeed every 1-3 hours.  Newborns should not go without breastfeeding for longer than 3 hours during the day or 5 hours during the night.  You should breastfeed your baby a minimum of 8 times in a 24-hour period. Breast milk pumping     Pumping and storing breast milk allows you to make sure that your baby is exclusively fed your breast milk, even at times when you are unable to breastfeed. This is especially important if you go back to work while you are still breastfeeding, or if you are not able to be present during feedings. Your lactation consultant can help you find a method of pumping that works best for you and give you guidelines about how long it is safe to store breast milk. Caring for your breasts while you breastfeed Nipples can become dry, cracked, and sore while breastfeeding. The following recommendations can help keep your breasts moisturized and healthy:  Avoid using soap on your nipples.  Wear a supportive bra designed especially for nursing. Avoid wearing underwire-style bras or extremely tight bras (sports bras).  Air-dry your nipples for 3-4 minutes after each feeding.  Use only cotton bra pads to absorb leaked breast milk. Leaking of breast milk between feedings is normal.  Use lanolin on your nipples after breastfeeding. Lanolin helps   to maintain your skin's normal moisture barrier. Pure lanolin is not harmful (not toxic) to your baby. You may also hand express a few drops of breast milk and gently massage that milk into your nipples and allow the milk to air-dry. In the first few weeks after giving birth, some women experience  breast engorgement. Engorgement can make your breasts feel heavy, warm, and tender to the touch. Engorgement peaks within 3-5 days after you give birth. The following recommendations can help to ease engorgement:  Completely empty your breasts while breastfeeding or pumping. You may want to start by applying warm, moist heat (in the shower or with warm, water-soaked hand towels) just before feeding or pumping. This increases circulation and helps the milk flow. If your baby does not completely empty your breasts while breastfeeding, pump any extra milk after he or she is finished.  Apply ice packs to your breasts immediately after breastfeeding or pumping, unless this is too uncomfortable for you. To do this: ? Put ice in a plastic bag. ? Place a towel between your skin and the bag. ? Leave the ice on for 20 minutes, 2-3 times a day.  Make sure that your baby is latched on and positioned properly while breastfeeding. If engorgement persists after 48 hours of following these recommendations, contact your health care provider or a lactation consultant. Overall health care recommendations while breastfeeding  Eat 3 healthy meals and 3 snacks every day. Well-nourished mothers who are breastfeeding need an additional 450-500 calories a day. You can meet this requirement by increasing the amount of a balanced diet that you eat.  Drink enough water to keep your urine pale yellow or clear.  Rest often, relax, and continue to take your prenatal vitamins to prevent fatigue, stress, and low vitamin and mineral levels in your body (nutrient deficiencies).  Do not use any products that contain nicotine or tobacco, such as cigarettes and e-cigarettes. Your baby may be harmed by chemicals from cigarettes that pass into breast milk and exposure to secondhand smoke. If you need help quitting, ask your health care provider.  Avoid alcohol.  Do not use illegal drugs or marijuana.  Talk with your health care  provider before taking any medicines. These include over-the-counter and prescription medicines as well as vitamins and herbal supplements. Some medicines that may be harmful to your baby can pass through breast milk.  It is possible to become pregnant while breastfeeding. If birth control is desired, ask your health care provider about options that will be safe while breastfeeding your baby. Where to find more information: [employer]: www.llli.org Contact a health care provider if:  You feel like you want to stop breastfeeding or have become frustrated with breastfeeding.  Your nipples are cracked or bleeding.  Your breasts are red, tender, or warm.  You have: ? Painful breasts or nipples. ? A swollen area on either breast. ? A fever or chills. ? Nausea or vomiting. ? Drainage other than breast milk from your nipples.  Your breasts do not become full before feedings by the fifth day after you give birth.  You feel sad and depressed.  Your baby is: ? Too sleepy to eat well. ? Having trouble sleeping. ? More than 1 week old and wetting fewer than 6 diapers in a 24-hour period. ? Not gaining weight by 5 days of age.  Your baby has fewer than 3 stools in a 24-hour period.  Your baby's skin or the white parts of   his or her eyes become yellow. Get help right away if:  Your baby is overly tired (lethargic) and does not want to wake up and feed.  Your baby develops an unexplained fever. Summary  Breastfeeding offers many health benefits for infant and mothers.  Try to breastfeed your infant when he or she shows early signs of hunger.  Gently tickle or stroke your baby's lips with your finger or nipple to allow the baby to open his or her mouth. Bring the baby to your breast. Make sure that much of the areola is in your baby's mouth. Offer one side and burp the baby before you offer the other side.  Talk with your health care provider or lactation consultant  if you have questions or you face problems as you breastfeed. This information is not intended to replace advice given to you by your health care provider. Make sure you discuss any questions you have with your health care provider. Document Revised: 05/07/2017 Document Reviewed: 03/14/2016 Elsevier Patient Education  2020 Else[employer] Inc.  

## 2019-09-12 NOTE — Progress Notes (Signed)
   Subjective:    Crystal Esparza is a [id number] [redacted]w[redacted]d being seen today for her first obstetrical visit.  Her obstetrical history is significant for obesity and previous cesarean section x 2. Patient does intend to breast feed. Pregnancy history fully reviewed.  Patient reports no complaints.  Vitals:   09/12/19 0955  BP: 123/83  Pulse: 82  Weight: 198 lb 1.6 oz (89.9 kg)    HISTORY: OB History  Gravida Para Term Preterm AB Living  3 2 2     2   SAB TAB Ectopic Multiple Live Births               # Outcome Date GA Lbr Len/2nd Weight Sex Delivery Anes PTL Lv  3 Current           2 Term 07/30/09     CS-LTranv     1 Term      CS-LTranv      Past Medical History:  Diagnosis Date  . Depression   . Vaginal Pap smear, abnormal    Past Surgical History:  Procedure Laterality Date  . CESAREAN SECTION     Family History  Problem Relation Age of Onset  . Cancer Mother   . ADD / ADHD Son   . Cancer Maternal Aunt   . Cancer Paternal Grandmother      Exam    Uterus:     Pelvic Exam:    Perineum: No Hemorrhoids, Normal Perineum   Vulva: normal   Vagina:  normal mucosa, normal discharge   pH:    Cervix: multiparous appearance and cervix is closed and long   Adnexa: no mass, fullness, tenderness   Bony Pelvis: gynecoid  System: Breast:  normal appearance, no masses or tenderness   Skin: normal coloration and turgor, no rashes    Neurologic: oriented, no focal deficits   Extremities: normal strength, tone, and muscle mass   HEENT extra ocular movement intact   Mouth/Teeth mucous membranes moist, pharynx normal without lesions and dental hygiene good   Neck supple and no masses   Cardiovascular: regular rate and rhythm   Respiratory:  appears well, vitals normal, no respiratory distress, acyanotic, normal RR, chest clear, no wheezing, crepitations, rhonchi, normal symmetric air entry   Abdomen: soft, non-tender; bowel sounds normal; no masses,  no organomegaly   Urinary:        Assessment:    Pregnancy: 09/29/09 Patient Active Problem List   Diagnosis Date Noted  . Maternal obesity affecting pregnancy, antepartum 09/12/2019  . Previous cesarean delivery affecting pregnancy 09/12/2019  . Supervision of normal pregnancy, antepartum 09/05/2019        Plan:     Initial labs drawn. Prenatal vitamins. Problem list reviewed and updated. Genetic Screening discussed : panorama ordered.  Ultrasound discussed; fetal survey: ordered. RX ASA provided  Follow up in 4 weeks. 50% of 30 min visit spent on counseling and coordination of care.     Adelard Sanon 09/12/2019

## 2019-09-13 LAB — CERVICOVAGINAL ANCILLARY ONLY
Bacterial Vaginitis (gardnerella): NEGATIVE
Candida Glabrata: NEGATIVE
Candida Vaginitis: NEGATIVE
Chlamydia: NEGATIVE
Comment: NEGATIVE
Comment: NEGATIVE
Comment: NEGATIVE
Comment: NEGATIVE
Comment: NEGATIVE
Comment: NORMAL
Neisseria Gonorrhea: NEGATIVE
Trichomonas: NEGATIVE

## 2019-09-13 LAB — OBSTETRIC PANEL, INCLUDING HIV
Antibody Screen: NEGATIVE
Basophils Absolute: 0 10*3/uL (ref 0.0–0.2)
Basos: 0 %
EOS (ABSOLUTE): 0.1 10*3/uL (ref 0.0–0.4)
Eos: 1 %
HIV Screen 4th Generation wRfx: NONREACTIVE
Hematocrit: 38.7 % (ref 34.0–46.6)
Hemoglobin: 13.5 g/dL (ref 11.1–15.9)
Hepatitis B Surface Ag: NEGATIVE
Immature Grans (Abs): 0 10*3/uL (ref 0.0–0.1)
Immature Granulocytes: 0 %
Lymphocytes Absolute: 2 10*3/uL (ref 0.7–3.1)
Lymphs: 22 %
MCH: 30.5 pg (ref 26.6–33.0)
MCHC: 34.9 g/dL (ref 31.5–35.7)
MCV: 87 fL (ref 79–97)
Monocytes Absolute: 0.6 10*3/uL (ref 0.1–0.9)
Monocytes: 7 %
Neutrophils Absolute: 6.4 10*3/uL (ref 1.4–7.0)
Neutrophils: 70 %
Platelets: 271 10*3/uL (ref 150–450)
RBC: 4.43 x10E6/uL (ref 3.77–5.28)
RDW: 12.9 % (ref 11.7–15.4)
RPR Ser Ql: NONREACTIVE
Rh Factor: POSITIVE
Rubella Antibodies, IGG: 1.08 index (ref >0.99)
WBC: 9.2 10*3/uL (ref 3.4–10.8)

## 2019-09-13 LAB — HEPATITIS C ANTIBODY: Hep C Virus Ab: <0.1 s/co ratio (ref 0.0–0.9)

## 2019-09-13 LAB — HEMOGLOBIN A1C
Est. average glucose Bld gHb Est-mCnc: 111 mg/dL
Hgb A1c MFr Bld: 5.5 % (ref 4.8–5.6)

## 2019-09-15 LAB — CYTOLOGY - PAP
Comment: NEGATIVE
Diagnosis: UNDETERMINED — AB
High risk HPV: NEGATIVE

## 2019-09-15 LAB — URINE CULTURE, OB REFLEX

## 2019-09-15 LAB — CULTURE, OB URINE

## 2019-09-20 ENCOUNTER — Telehealth: Payer: Self-pay | Admitting: *Deleted

## 2019-09-20 ENCOUNTER — Encounter: Payer: Self-pay | Admitting: Obstetrics and Gynecology

## 2019-09-20 NOTE — Telephone Encounter (Signed)
Pt called to office for results.  Spoke with pt. Made aware of results and that she may call back to check on [facility] results.

## 2019-09-21 ENCOUNTER — Other Ambulatory Visit: Payer: Self-pay | Admitting: Obstetrics and Gynecology

## 2019-09-21 ENCOUNTER — Telehealth: Payer: Self-pay | Admitting: *Deleted

## 2019-09-21 DIAGNOSIS — Z3490 Encounter for supervision of normal pregnancy, unspecified, unspecified trimester: Secondary | ICD-10-CM

## 2019-09-21 NOTE — Telephone Encounter (Signed)
[facility] called in to office regarding pt Panorama testing.  Pt was at low risk for any Trisomies but did have an atypical variant and was unable to determine gender of fetus.  Avelina Laine recommends that the pt may have a GC consult, maternal chromosomal studies and/or ultrasound to determine gender as needed.      Please review and make recommendations.  Please route response to clinical pool.

## 2019-09-22 ENCOUNTER — Telehealth: Payer: Self-pay

## 2019-09-22 NOTE — Telephone Encounter (Signed)
-----   Message from Crystal [place], MD sent at 09/21/2019  5:04 PM EDT ----- Please inform patient that she is being referred to genetic counseling because her genetic screening test was unable to determine gender of fetus. Please stress to the patient that we are not saying that there is something wring with her baby. The test is a screening test only. The genetic counselor will go over the meaning of the results. She is scheduled for her anatomy ultrasound in September. If she asks, we are not rescheduling her anatomy scan earlier for the purpose of identifying gender only.  Thanks  [employer]

## 2019-09-22 NOTE — Telephone Encounter (Signed)
Pt made aware of [facility] Results and that Genetic Counselor Referral has been made pt to expect a call to make appt.

## 2019-09-26 ENCOUNTER — Encounter: Payer: Self-pay | Admitting: Obstetrics and Gynecology

## 2019-10-10 ENCOUNTER — Other Ambulatory Visit: Payer: Self-pay

## 2019-10-10 ENCOUNTER — Ambulatory Visit (INDEPENDENT_AMBULATORY_CARE_PROVIDER_SITE_OTHER): Payer: Medicaid Other | Admitting: Advanced Practice Midwife

## 2019-10-10 VITALS — BP 106/70 | HR 90 | Wt 201.4 lb

## 2019-10-10 DIAGNOSIS — O34219 Maternal care for unspecified type scar from previous cesarean delivery: Secondary | ICD-10-CM

## 2019-10-10 DIAGNOSIS — Z3490 Encounter for supervision of normal pregnancy, unspecified, unspecified trimester: Secondary | ICD-10-CM

## 2019-10-10 DIAGNOSIS — Z3A16 16 weeks gestation of pregnancy: Secondary | ICD-10-CM

## 2019-10-10 NOTE — Patient Instructions (Signed)
Call Crystal Esparza to schedule appointment with a genetic counselor at [phone] or go to [email or web address]     Second Trimester of Pregnancy The second trimester is from week 14 through week 27 (months 4 through 6). The second trimester is often a time when you feel your best. Your body has adjusted to being pregnant, and you begin to feel better physically. Usually, morning sickness has lessened or quit completely, you may have more energy, and you may have an increase in appetite. The second trimester is also a time when the fetus is growing rapidly. At the end of the sixth month, the fetus is about 9 inches long and weighs about 1 pounds. You will likely begin to feel the baby move (quickening) between 16 and 20 weeks of pregnancy. Body changes during your second trimester Your body continues to go through many changes during your second trimester. The changes vary from woman to woman.  Your weight will continue to increase. You will notice your lower abdomen bulging out.  You may begin to get stretch marks on your hips, abdomen, and breasts.  You may develop headaches that can be relieved by medicines. The medicines should be approved by your health care provider.  You may urinate more often because the fetus is pressing on your bladder.  You may develop or continue to have heartburn as a result of your pregnancy.  You may develop constipation because certain hormones are causing the muscles that push waste through your intestines to slow down.  You may develop hemorrhoids or swollen, bulging veins (varicose veins).  You may have back pain. This is caused by: ? Weight gain. ? Pregnancy hormones that are relaxing the joints in your pelvis. ? A shift in weight and the muscles that support your balance.  Your breasts will continue to grow and they will continue to become tender.  Your gums may bleed and may be sensitive to brushing and flossing.  Dark spots or blotches (chloasma, mask of  pregnancy) may develop on your face. This will likely fade after the baby is born.  A dark line from your belly button to the pubic area (linea nigra) may appear. This will likely fade after the baby is born.  You may have changes in your hair. These can include thickening of your hair, rapid growth, and changes in texture. Some women also have hair loss during or after pregnancy, or hair that feels dry or thin. Your hair will most likely return to normal after your baby is born. What to expect at prenatal visits During a routine prenatal visit:  You will be weighed to make sure you and the fetus are growing normally.  Your blood pressure will be taken.  Your abdomen will be measured to track your baby's growth.  The fetal heartbeat will be listened to.  Any test results from the previous visit will be discussed. Your health care provider may ask you:  How you are feeling.  If you are feeling the baby move.  If you have had any abnormal symptoms, such as leaking fluid, bleeding, severe headaches, or abdominal cramping.  If you are using any tobacco products, including cigarettes, chewing tobacco, and electronic cigarettes.  If you have any questions. Other tests that may be performed during your second trimester include:  Blood tests that check for: ? Low iron levels (anemia). ? High blood sugar that affects pregnant women (gestational diabetes) between 88 and 28 weeks. ? Rh antibodies. This is to check for a  protein on red blood cells (Rh factor).  Urine tests to check for infections, diabetes, or protein in the urine.  An ultrasound to confirm the proper growth and development of the baby.  An amniocentesis to check for possible genetic problems.  Fetal screens for spina bifida and Down syndrome.  HIV (human immunodeficiency virus) testing. Routine prenatal testing includes screening for HIV, unless you choose not to have this test. Follow these instructions at  home: Medicines  Follow your health care provider's instructions regarding medicine use. Specific medicines may be either safe or unsafe to take during pregnancy.  Take a prenatal vitamin that contains at least 600 micrograms (mcg) of folic acid.  If you develop constipation, try taking a stool softener if your health care provider approves. Eating and drinking   Eat a balanced diet that includes fresh fruits and vegetables, whole grains, good sources of protein such as meat, eggs, or tofu, and low-fat dairy. Your health care provider will help you determine the amount of weight gain that is right for you.  Avoid raw meat and uncooked cheese. These carry germs that can cause birth defects in the baby.  If you have low calcium intake from food, talk to your health care provider about whether you should take a daily calcium supplement.  Limit foods that are high in fat and processed sugars, such as fried and sweet foods.  To prevent constipation: ? Drink enough fluid to keep your urine clear or pale yellow. ? Eat foods that are high in fiber, such as fresh fruits and vegetables, whole grains, and beans. Activity  Exercise only as directed by your health care provider. Most women can continue their usual exercise routine during pregnancy. Try to exercise for 30 minutes at least 5 days a week. Stop exercising if you experience uterine contractions.  Avoid heavy lifting, wear low heel shoes, and practice good posture.  A sexual relationship may be continued unless your health care provider directs you otherwise. Relieving pain and discomfort  Wear a good support bra to prevent discomfort from breast tenderness.  Take warm sitz baths to soothe any pain or discomfort caused by hemorrhoids. Use hemorrhoid cream if your health care provider approves.  Rest with your legs elevated if you have leg cramps or low back pain.  If you develop varicose veins, wear support hose. Elevate your feet  for 15 minutes, 3-4 times a day. Limit salt in your diet. Prenatal Care  Write down your questions. Take them to your prenatal visits.  Keep all your prenatal visits as told by your health care provider. This is important. Safety  Wear your seat belt at all times when driving.  Make a list of emergency phone numbers, including numbers for family, friends, the hospital, and police and fire departments. General instructions  Ask your health care provider for a referral to a local prenatal education class. Begin classes no later than the beginning of month 6 of your pregnancy.  Ask for help if you have counseling or nutritional needs during pregnancy. Your health care provider can offer advice or refer you to specialists for help with various needs.  Do not use hot tubs, steam rooms, or saunas.  Do not douche or use tampons or scented sanitary pads.  Do not cross your legs for long periods of time.  Avoid cat litter boxes and soil used by cats. These carry germs that can cause birth defects in the baby and possibly loss of the fetus by miscarriage  or stillbirth.  Avoid all smoking, herbs, alcohol, and unprescribed drugs. Chemicals in these products can affect the formation and growth of the baby.  Do not use any products that contain nicotine or tobacco, such as cigarettes and e-cigarettes. If you need help quitting, ask your health care provider.  Visit your dentist if you have not gone yet during your pregnancy. Use a soft toothbrush to brush your teeth and be gentle when you floss. Contact a health care provider if:  You have dizziness.  You have mild pelvic cramps, pelvic pressure, or nagging pain in the abdominal area.  You have persistent nausea, vomiting, or diarrhea.  You have a bad smelling vaginal discharge.  You have pain when you urinate. Get help right away if:  You have a fever.  You are leaking fluid from your vagina.  You have spotting or bleeding from your  vagina.  You have severe abdominal cramping or pain.  You have rapid weight gain or weight loss.  You have shortness of breath with chest pain.  You notice sudden or extreme swelling of your face, hands, ankles, feet, or legs.  You have not felt your baby move in over an hour.  You have severe headaches that do not go away when you take medicine.  You have vision changes. Summary  The second trimester is from week 14 through week 27 (months 4 through 6). It is also a time when the fetus is growing rapidly.  Your body goes through many changes during pregnancy. The changes vary from woman to woman.  Avoid all smoking, herbs, alcohol, and unprescribed drugs. These chemicals affect the formation and growth your baby.  Do not use any tobacco products, such as cigarettes, chewing tobacco, and e-cigarettes. If you need help quitting, ask your health care provider.  Contact your health care provider if you have any questions. Keep all prenatal visits as told by your health care provider. This is important. This information is not intended to replace advice given to you by your health care provider. Make sure you discuss any questions you have with your health care provider. Document Revised: 06/04/2018 Document Reviewed: 03/18/2016 Elsevier Patient Education  2020 [employer].

## 2019-10-10 NOTE — Progress Notes (Signed)
Patient reports fetal flutter movements and some pressure.

## 2019-10-10 NOTE — Progress Notes (Addendum)
   PRENATAL VISIT NOTE  Subjective:  Crystal Esparza is a 34 y.o. G3P2002 at [redacted]w[redacted]d being seen today for ongoing prenatal care.  She is currently monitored for the following issues for this low-risk pregnancy and has Supervision of normal pregnancy, antepartum; Maternal obesity affecting pregnancy, antepartum; and Previous cesarean delivery affecting pregnancy on their problem list.  Patient reports no complaints.  Contractions: Not present. Vag. Bleeding: None.  Movement: Present. Denies leaking of fluid.   The following portions of the patient's history were reviewed and updated as appropriate: allergies, current medications, past family history, past medical history, past social history, past surgical history and problem list.   Objective:   Vitals:   10/10/19 0852  BP: 106/70  Pulse: 90  Weight: 201 lb 6.4 oz (91.4 kg)    Fetal Status:     Movement: Present     General:  Alert, oriented and cooperative. Patient is in no acute distress.  Skin: Skin is warm and dry. No rash noted.   Cardiovascular: Normal heart rate noted  Respiratory: Normal respiratory effort, no problems with respiration noted  Abdomen: Soft, gravid, appropriate for gestational age.  Pain/Pressure: Present     Pelvic: Cervical exam deferred        Extremities: Normal range of motion.  Edema: None  Mental Status: Normal mood and affect. Normal behavior. Normal judgment and thought content.   Assessment and Plan:  Pregnancy: G3P2002 at [redacted]w[redacted]d 1. Encounter for supervision of normal pregnancy, antepartum, unspecified gravidity --Anticipatory guidance about next visits/weeks of pregnancy given. --Reviewed genetic testing results. Unable to determine gender but low risk NIPS results.  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  Discussed partner testing with [facility]. Pt declines. --Next visit in 4 week virtual  2. Previous cesarean delivery affecting pregnancy --C/S x 2, plans repeat  3. [redacted] weeks gestation of  pregnancy   Preterm labor symptoms and general obstetric precautions including but not limited to vaginal bleeding, contractions, leaking of fluid and fetal movement were reviewed in detail with the patient. Please refer to After Visit Summary for other counseling recommendations.   Return in about 4 weeks (around 11/07/2019).  Future Appointments  Date Time Provider Department Center  11/01/2019  9:30 AM [facility] NURSE [facility]  11/01/2019  9:45 AM [facility] US4 [facility]  11/01/2019 10:30 AM [facility] GENETIC COUNSELING RM [facility]    Sharen Counter, CNM

## 2019-10-24 ENCOUNTER — Inpatient Hospital Stay (HOSPITAL_COMMUNITY)
Admission: AD | Admit: 2019-10-24 | Discharge: 2019-10-25 | Disposition: A | Discharge status: Home / Self Care | Payer: Medicaid Other | Attending: Obstetrics and Gynecology | Admitting: Obstetrics and Gynecology

## 2019-10-24 ENCOUNTER — Telehealth: Payer: Self-pay | Admitting: *Deleted

## 2019-10-24 DIAGNOSIS — O99891 Other specified diseases and conditions complicating pregnancy: Secondary | ICD-10-CM

## 2019-10-24 DIAGNOSIS — Z3A18 18 weeks gestation of pregnancy: Secondary | ICD-10-CM | POA: Diagnosis not present

## 2019-10-24 DIAGNOSIS — Z7982 Long term (current) use of aspirin: Secondary | ICD-10-CM | POA: Diagnosis not present

## 2019-10-24 DIAGNOSIS — B9689 Other specified bacterial agents as the cause of diseases classified elsewhere: Secondary | ICD-10-CM | POA: Diagnosis not present

## 2019-10-24 DIAGNOSIS — N760 Acute vaginitis: Secondary | ICD-10-CM

## 2019-10-24 DIAGNOSIS — Z79899 Other long term (current) drug therapy: Secondary | ICD-10-CM | POA: Insufficient documentation

## 2019-10-24 DIAGNOSIS — O200 Threatened abortion: Secondary | ICD-10-CM | POA: Insufficient documentation

## 2019-10-24 DIAGNOSIS — F17200 Nicotine dependence, unspecified, uncomplicated: Secondary | ICD-10-CM | POA: Diagnosis not present

## 2019-10-24 DIAGNOSIS — M549 Dorsalgia, unspecified: Secondary | ICD-10-CM | POA: Diagnosis not present

## 2019-10-24 DIAGNOSIS — O23592 Infection of other part of genital tract in pregnancy, second trimester: Secondary | ICD-10-CM | POA: Insufficient documentation

## 2019-10-24 DIAGNOSIS — O26892 Other specified pregnancy related conditions, second trimester: Secondary | ICD-10-CM | POA: Diagnosis present

## 2019-10-24 NOTE — ED Triage Notes (Signed)
Emergency Medicine Provider OB Triage Evaluation Note  Crystal Esparza is a 34 y.o. female, G3P2002, at [redacted]w[redacted]d gestation who presents to the emergency department with complaints of vaginal bleeding during second trimester pregnancy.  Review of  Systems  Positive: Vaginal bleeding and abdominal cramping Negative: Fevers chills or urinary symptoms  Physical Exam  BP 117/72 (BP Location: Left Arm)   Pulse 64   Temp 97.8 F (36.6 C) (Oral)   Resp 17   Ht 1.676 m (5\' 6" )   Wt 92.1 kg   LMP 05/24/2019   SpO2 100%   BMI 32.77 kg/m  General: Awake, no distress  HEENT: Atraumatic  Resp: Normal effort  Cardiac: Normal rate Abd: Nondistended, nontender  MSK: Moves all extremities without difficulty Neuro: Speech clear  Medical Decision Making  Pt evaluated for pregnancy concern and is stable for transfer to MAU. Pt is in agreement with plan for transfer.  11:59 PM Discussed with MAU APP, 05/26/2019, who accepts patient in transfer.  Clinical Impression   1. Vaginal bleeding during pregnancy       Suzette Battiest, MD 10/25/19 0000

## 2019-10-24 NOTE — ED Triage Notes (Signed)
Pt reports being approx [redacted] weeks pregnant. Began having lower abd pressure last week but then spotting x 1 hr, noted small amount of bright red blood on toilet paper. No acute distress is noted.

## 2019-10-24 NOTE — Telephone Encounter (Signed)
Pt states she was moving/lifting heavy furniture and is now having back pain and cramping. Pt denies bleeding, LOF -  States +FM. Pt advised that she should not be moving heavy furniture.  Advised that she may have strained her back in the process. Pt advised to take 2 extra strength Tylenol and lots of fluids and rest. Pt made aware she may take Tylenol every 6 hours as needed and she may use heat pack to back. Advised if no relief or symptoms become worse to call office or be seen at hospital.  Pt states understanding.

## 2019-10-25 ENCOUNTER — Other Ambulatory Visit: Payer: Self-pay

## 2019-10-25 ENCOUNTER — Encounter (HOSPITAL_COMMUNITY): Payer: Self-pay | Admitting: Obstetrics and Gynecology

## 2019-10-25 DIAGNOSIS — B9689 Other specified bacterial agents as the cause of diseases classified elsewhere: Secondary | ICD-10-CM

## 2019-10-25 DIAGNOSIS — O99891 Other specified diseases and conditions complicating pregnancy: Secondary | ICD-10-CM

## 2019-10-25 DIAGNOSIS — M549 Dorsalgia, unspecified: Secondary | ICD-10-CM

## 2019-10-25 DIAGNOSIS — N760 Acute vaginitis: Secondary | ICD-10-CM

## 2019-10-25 DIAGNOSIS — Z3A18 18 weeks gestation of pregnancy: Secondary | ICD-10-CM

## 2019-10-25 LAB — WET PREP, GENITAL
Sperm: NONE SEEN
Trich, Wet Prep: NONE SEEN
Yeast Wet Prep HPF POC: NONE SEEN

## 2019-10-25 LAB — URINALYSIS, ROUTINE W REFLEX MICROSCOPIC
Bilirubin Urine: NEGATIVE
Glucose, UA: NEGATIVE mg/dL
Hgb urine dipstick: NEGATIVE
Ketones, ur: NEGATIVE mg/dL
Nitrite: NEGATIVE
Protein, ur: NEGATIVE mg/dL
Specific Gravity, Urine: 1.02 (ref 1.005–1.030)
pH: 6 (ref 5.0–8.0)

## 2019-10-25 LAB — GC/CHLAMYDIA PROBE AMP (~~LOC~~) NOT AT ARMC
Chlamydia: NEGATIVE
Comment: NEGATIVE
Comment: NORMAL
Neisseria Gonorrhea: NEGATIVE

## 2019-10-25 MED ORDER — METRONIDAZOLE 500 MG PO TABS
500.0000 mg | ORAL_TABLET | Freq: Two times a day (BID) | ORAL | 0 refills | Status: DC
Start: 2019-10-25 — End: 2020-02-02

## 2019-10-25 NOTE — MAU Provider Note (Signed)
History     CSN: [id number]  Arrival date and time: 10/24/19 2350   First Provider Initiated Contact with Patient 10/25/19 0100      Chief Complaint  Patient presents with  . Vaginal Bleeding   Crystal Esparza is a 34 y.o. G3P2 at 40w1dwho presents to MAU with complaints of vaginal bleeding and back pain.  Patient reports over the weekend moving heavy furniture, since then has been having lower back pain and mild abdominal cramping.  Patient reports that pain is intermittent, rates pain 4 out of 10 has not taking any medication for pain.  Patient reports discussing back pain with office, was told not to lift anything over 20 pounds, rest and hydrate.  She reports that she has been following instructions and was told to monitor for any worsening pain or bleeding.  Patient reports that she started having vaginal spotting last night around 10 PM after putting her kids to bed.  Describes the spotting as light pink when she wipes, denies having to wear panty liner or pad for bleeding.  She denies any recent intercourse.  She denies leaking of fluid or vaginal discharge prior to bleeding occurring.   OB History    Gravida  3   Para  2   Term  2   Preterm      AB      Living  2     SAB      TAB      Ectopic      Multiple      Live Births  2           Past Medical History:  Diagnosis Date  . Depression   . Vaginal Pap smear, abnormal     Past Surgical History:  Procedure Laterality Date  . CESAREAN SECTION      Family History  Problem Relation Age of Onset  . Cancer Mother   . ADD / ADHD Son   . Cancer Maternal Aunt   . Cancer Paternal Grandmother     Social History   Tobacco Use  . Smoking status: Current Some Day Smoker  . Smokeless tobacco: Never Used  Vaping Use  . Vaping Use: Never used  Substance Use Topics  . Alcohol use: Never  . Drug use: Never    Allergies: No Known Allergies  Medications Prior to Admission  Medication Sig Dispense  Refill Last Dose  . aspirin EC 81 MG tablet Take 1 tablet (81 mg total) by mouth daily. Take after 12 weeks for prevention of preeclampsia later in pregnancy 300 tablet 2 10/24/2019 at Unknown time  . Prenatal Vit-Fe Fumarate-FA (PRENATAL MULTIVITAMIN) TABS tablet Take 1 tablet by mouth daily at 12 noon.   10/24/2019 at Unknown time  . Blood Pressure Monitoring (BLOOD PRESSURE KIT) DEVI 1 kit by Does not apply route once a week. Check Blood Pressure regularly and record readings into the [employer] App.  Large Cuff.  DX O90.0 1 each 0     Review of Systems  Constitutional: Negative.   Respiratory: Negative.   Cardiovascular: Negative.   Gastrointestinal: Positive for abdominal pain. Negative for constipation, diarrhea, nausea and vomiting.       Intermittent lower abdominal cramping   Genitourinary: Positive for vaginal bleeding. Negative for difficulty urinating, dysuria, frequency, pelvic pain and urgency.  Musculoskeletal: Positive for back pain.  Neurological: Negative.   Psychiatric/Behavioral: Negative.    Physical Exam   Blood pressure 103/63, pulse 77, temperature 98.5 F (36.9  C), temperature source Oral, resp. rate 20, height '5\' 6"'  (1.676 m), weight 93.3 kg, last menstrual period 05/24/2019, SpO2 100 %.  Physical Exam Exam conducted with a chaperone present.  HENT:     Head: Normocephalic.  Cardiovascular:     Rate and Rhythm: Normal rate and regular rhythm.  Pulmonary:     Effort: Pulmonary effort is normal. No respiratory distress.     Breath sounds: Normal breath sounds. No wheezing.  Abdominal:     Palpations: Abdomen is soft. There is no mass.     Tenderness: There is no abdominal tenderness. There is no guarding.  Genitourinary:    Exam position: Lithotomy position.     Vagina: Vaginal discharge present. No bleeding.     Comments: Pelvic exam: Cervix pink, visually closed, without lesion, moderate amount of white creamy discharge, vaginal walls and external  genitalia normal, no vaginal bleeding present  Neurological:     Mental Status: She is alert and oriented to person, place, and time.  Psychiatric:        Mood and Affect: Mood normal.        Behavior: Behavior normal.        Thought Content: Thought content normal.    Dilation: Closed Effacement (%): Thick Cervical Position: Posterior Exam by:: V Maxten Shuler CNM  FHR 160 by doppler   MAU Course  Procedures  MDM Wet prep  GC/C UA   Labs reviewed:  Results for orders placed or performed during the hospital encounter of 10/24/19 (from the past 24 hour(s))  Urinalysis, Routine w reflex microscopic Urine, Clean Catch     Status: Abnormal   Collection Time: 10/25/19 12:43 AM  Result Value Ref Range   Color, Urine YELLOW YELLOW   APPearance HAZY (A) CLEAR   Specific Gravity, Urine 1.020 1.005 - 1.030   pH 6.0 5.0 - 8.0   Glucose, UA NEGATIVE NEGATIVE mg/dL   Hgb urine dipstick NEGATIVE NEGATIVE   Bilirubin Urine NEGATIVE NEGATIVE   Ketones, ur NEGATIVE NEGATIVE mg/dL   Protein, ur NEGATIVE NEGATIVE mg/dL   Nitrite NEGATIVE NEGATIVE   Leukocytes,Ua TRACE (A) NEGATIVE   RBC / HPF 0-5 0 - 5 RBC/hpf   WBC, UA 0-5 0 - 5 WBC/hpf   Bacteria, UA RARE (A) NONE SEEN   Squamous Epithelial / LPF 6-10 0 - 5   Mucus PRESENT   Wet prep, genital     Status: Abnormal   Collection Time: 10/25/19  1:00 AM  Result Value Ref Range   Yeast Wet Prep HPF POC NONE SEEN NONE SEEN   Trich, Wet Prep NONE SEEN NONE SEEN   Clue Cells Wet Prep HPF POC PRESENT (A) NONE SEEN   WBC, Wet Prep HPF POC MODERATE (A) NONE SEEN   Sperm NONE SEEN    No vaginal bleeding present, no formal US ordered. ABO/Rh: O Pos, no Rhogam needed.  Will treat for BV based on physical examination and clinical symptoms. Rx for Flagyl sent to pharmacy of choice. Educated and discussed pregnancy restrictions and to not lift anything over 20lbs- patient verbalizes understanding. Encouraged to abstain from IC for 7 days during  treatment for BV- patient verbalizes understanding.   Discussed reasons to return to MAU. Follow up as scheduled in the office. Return to MAU as needed. Pt stable at time of discharge.   Assessment and Plan   1. Bacterial vaginosis   2. Back pain affecting pregnancy in second trimester   3. [redacted] weeks gestation of pregnancy  Discharge home Follow up as scheduled in the office for prenatal care Return to MAU as needed for reasons discussed and/or emergencies  Rx for Flagyl    Follow-up [facility] Follow up.   Specialty: Obstetrics and Gynecology Why: Follow up as scheduled for prenatal care  Contact information: [address] [place] [phone]             Allergies as of 10/25/2019   No Known Allergies     Medication List    TAKE these medications   aspirin EC 81 MG tablet Take 1 tablet (81 mg total) by mouth daily. Take after 12 weeks for prevention of preeclampsia later in pregnancy   Blood Pressure Kit Devi 1 kit by Does not apply route once a week. Check Blood Pressure regularly and record readings into the Babyscripts App.  Large Cuff.  DX O90.0   metroNIDAZOLE 500 MG tablet Commonly known as: FLAGYL Take 1 tablet (500 mg total) by mouth 2 (two) times daily.   prenatal multivitamin Tabs tablet Take 1 tablet by mouth daily at 12 noon.       Lajean Manes CNM 10/25/2019, 1:44 AM

## 2019-10-25 NOTE — ED Notes (Signed)
Report called to MAU  

## 2019-10-25 NOTE — MAU Note (Signed)
PT WENT TO [place]  TONIGHT -  SAYS OVER WEEKEND  MOVED FURNITURE. YESTERDAY WAS LIGHT HEADED.  TODAY LOWER PELVIC PRESSURE- SAME NOW.  BACK HURTS - STARTED YESTERDAY . TOOK REG TYLENOL 2 TABS - AT 8PM-  SOME RELIEF . PNC WITH [facility]- RN TOLD HER TO COME HERE IF SPOTTING.    STARTED PINK  SPOTTING AT 1030- WHEN SHE WIPES . LAST SEX-  Friday .

## 2019-11-01 ENCOUNTER — Other Ambulatory Visit: Payer: Self-pay

## 2019-11-01 ENCOUNTER — Ambulatory Visit: Payer: Medicaid Other | Attending: Obstetrics and Gynecology

## 2019-11-01 ENCOUNTER — Ambulatory Visit: Payer: Medicaid Other | Admitting: *Deleted

## 2019-11-01 ENCOUNTER — Encounter: Payer: Self-pay | Admitting: *Deleted

## 2019-11-01 ENCOUNTER — Other Ambulatory Visit: Payer: Self-pay | Admitting: Obstetrics and Gynecology

## 2019-11-01 ENCOUNTER — Other Ambulatory Visit: Payer: Self-pay | Admitting: *Deleted

## 2019-11-01 ENCOUNTER — Ambulatory Visit (HOSPITAL_BASED_OUTPATIENT_CLINIC_OR_DEPARTMENT_OTHER): Payer: Medicaid Other | Admitting: Genetic Counselor

## 2019-11-01 DIAGNOSIS — Z3490 Encounter for supervision of normal pregnancy, unspecified, unspecified trimester: Secondary | ICD-10-CM

## 2019-11-01 DIAGNOSIS — Z315 Encounter for genetic counseling: Secondary | ICD-10-CM | POA: Diagnosis present

## 2019-11-01 DIAGNOSIS — O99210 Obesity complicating pregnancy, unspecified trimester: Secondary | ICD-10-CM | POA: Diagnosis present

## 2019-11-01 DIAGNOSIS — Z148 Genetic carrier of other disease: Secondary | ICD-10-CM

## 2019-11-01 DIAGNOSIS — Z6831 Body mass index (BMI) 31.0-31.9, adult: Secondary | ICD-10-CM

## 2019-11-01 DIAGNOSIS — O285 Abnormal chromosomal and genetic finding on antenatal screening of mother: Secondary | ICD-10-CM | POA: Diagnosis not present

## 2019-11-01 DIAGNOSIS — O280 Abnormal hematological finding on antenatal screening of mother: Secondary | ICD-10-CM

## 2019-11-01 DIAGNOSIS — E669 Obesity, unspecified: Secondary | ICD-10-CM

## 2019-11-01 DIAGNOSIS — O289 Unspecified abnormal findings on antenatal screening of mother: Secondary | ICD-10-CM

## 2019-11-01 DIAGNOSIS — O34219 Maternal care for unspecified type scar from previous cesarean delivery: Secondary | ICD-10-CM | POA: Diagnosis present

## 2019-11-01 DIAGNOSIS — Z3A19 19 weeks gestation of pregnancy: Secondary | ICD-10-CM

## 2019-11-01 DIAGNOSIS — O283 Abnormal ultrasonic finding on antenatal screening of mother: Secondary | ICD-10-CM

## 2019-11-01 DIAGNOSIS — Z363 Encounter for antenatal screening for malformations: Secondary | ICD-10-CM

## 2019-11-01 DIAGNOSIS — O358XX0 Maternal care for other (suspected) fetal abnormality and damage, not applicable or unspecified: Secondary | ICD-10-CM

## 2019-11-01 NOTE — Progress Notes (Signed)
11/01/2019  Crystal Esparza [birth year] MRN: [id number] DOV: 11/01/2019  Crystal Esparza presented to the [facility] for a genetics consultation regarding her noninvasive prenatal screening (NIPS) result that demonstrated an atypical finding on the sex chromosomes as well as her carrier status for spinal muscular atrophy. Crystal Esparza was accompanied to her appointment by her partner. Today's consultation was brief per patient request.  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  Prenatal history  Prenatal history was not reviewed in detail today given that it was a brief consultation. Crystal Esparza is a G2P2002, 34 y.o. female. Her current pregnancy has completed [redacted]w[redacted]d (Estimated Date of Delivery: 03/26/20). Crystal Esparza reported that she has a 77 year old son from a prior relationship and a 59 year old son with her current partner.  Family History  A three generation pedigree was not drafted and reviewed during today's appointment. However, per Crystal Esparza, both family histories were noncontributory for birth defects, intellectual disability, recurrent pregnancy loss, and known genetic conditions. The patient's ethnicity is [place] and [place]. The father of the pregnancy's ethnicity is African American.   Discussion  NIPS result:  Crystal Esparza had Panorama noninvasive prenatal screening (NIPS) through the laboratory [facility] that demonstrated an atypical finding on the sex chromosomes suspected to be of maternal origin. These results also showed a less than 1 in 10,000 risk for trisomies 21, 18 and 13, a low risk for triploidy, and a 1 in9000 risk for 22q11.2 deletion syndrome.  We reviewed thatNIPSanalyzes cell-free fetal DNAfrom the placenta found in the maternal bloodstream duringpregnancy.NIPS is used to determine the risk for missing  or extra fetal/placental chromosomal material for a specific subset of chromosomes. However, given that maternal DNA is also present in a NIPS sample, it is possible that NIPS results can be representative of the mother rather than the pregnancy. Panorama NIPS utilizes technology that is able to distinguish between placental and maternal DNA. Through this, it was suspected that the finding on the sex chromosomes identified in Crystal Esparza's sample is maternal in origin.  We discussed that there are several possibilities that could warrant Crystal Esparza's abnormal NIPS result. One possibility is Crystal Esparza having a genetic change involving her sex chromosomes. A second possibility is maternal mosaicism for a change involving the sex chromosomes. Mosaicism occurs when not every cell in the body is genetically identical; some cells in the body may have a genetic change involving the sex chromosomes, whereas others may have normal sex chromosomes. Finally, it is possible that this is a false positive result.  We reviewed that even if Crystal Esparza has a genetic change involving her sex chromosomes, it likely has not impacted her greatly clinically since she is healthy. We discussed that some genetic changes can impact health or development, whereas other genetic changes can be benign and/or familial. We discussed that maternal karyotyping is available to determine whether or not Crystal Esparza has a change involving her sex chromosomes, such as a deletion, duplication, insertion, or chromosomal rearrangement. A karyotype could also determine if she is mosaic for a sex chromosome abnormality. Crystal Esparza declined karyotype at this time.  Carrier screening result:  Crystal Esparza also had Horizon-14 carrier screening performed through [place]. 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   SMA is a condition caused by mutations in the SMN1 gene. Typically, individuals have two copies of the SMN1 gene, with one copy present on each chromosome. In SMA silent carriers, both copies of the SMN1 gene are present on one chromosome, with no copies of SMN1 present on the other chromosome.   SMA is characterized by progressive muscle weakness and atrophy due to degeneration and loss of anterior horn cells (lower motor neurons) in the spinal cord and brain stem. We discussed the different types of SMA (0, I, II, and III), including differences in severity and age of onset. We also reviewed the autosomal recessive inheritance pattern of SMA. We discussed that the couple only has a chance of having a child with SMA if both parents are identified as carriers for the condition. Based on the carrier frequency for SMA in the African American population, Crystal Esparza partner currently has a 1 in 34 chance of being a carrier of SMA. Without partner screening to refine risk and based on her partner's ethnicity, the couple currently has a 1 in 236 (0.4%) chance of having a child with SMA (assuming Crystal Esparza is truly a silent carrier for SMA). If both Crystal Esparza and her partner are carriers of SMA, the couple would have a 1 in 4 (25%) chance of having an affected fetus. We discussed that carrier screening for SMA is recommended for Crystal Esparza's partner to refine their risk of having an affected child. Crystal Esparza indicated that she is not interested in pursuing partner carrier screening. She was informed that SMA is included on [place] [facility]'s newborn screening, so her baby will be assessed for this within the first few weeks of life.  Diagnostic testing:  Crystal Esparza was also counseled regarding diagnostic testing via amniocentesis. Crystal Esparza was counseled that this is the only prenatal testing option available to definitively determine if the fetus has a chromosomal abnormality or  SMA prenatally. Ms. Clapsaddle was familial with amniocentesis and informed me that she is not interested in pursuing this. Amniocentesis is available at any point after 16 weeks of pregnancy. She may opt to undergo the procedure at a later date should she change her mind.  Plan:  Ms. Stelzner was not interested in pursuing maternal karyotype or any further prenatal testing at this time. She is aware that maternal karyotype remains an option at any time, including after she has delivered. I provided her with my contact information and encouraged her to contact me should she ever change her mind.  I counseled Ms. Mantey regarding the above risks and available options. The approximate face-to-face time with the genetic counselor was 20 minutes.  In summary:  Discussed NIPS result and options for follow-up testing  Atypical finding on sex chromosomes of suspected maternal origin  Declined maternal karyotype  Discussed carrier screening results and options for follow-up testing  Increased risk to be silent carrier for spinal muscular atrophy  Declined partner carrier screening. Baby will be screened for condition on newborn screening  Offered additional testing and screening  Declined amniocentesis   Gershon Crane, MS, [facility] Genetic Counselor

## 2019-11-07 ENCOUNTER — Ambulatory Visit (INDEPENDENT_AMBULATORY_CARE_PROVIDER_SITE_OTHER): Payer: Medicaid Other | Admitting: Nurse Practitioner

## 2019-11-07 ENCOUNTER — Encounter: Payer: Self-pay | Admitting: Nurse Practitioner

## 2019-11-07 ENCOUNTER — Encounter: Payer: Self-pay | Admitting: Obstetrics

## 2019-11-07 ENCOUNTER — Other Ambulatory Visit: Payer: Self-pay

## 2019-11-07 DIAGNOSIS — O359XX0 Maternal care for (suspected) fetal abnormality and damage, unspecified, not applicable or unspecified: Secondary | ICD-10-CM | POA: Insufficient documentation

## 2019-11-07 DIAGNOSIS — Z299 Encounter for prophylactic measures, unspecified: Secondary | ICD-10-CM

## 2019-11-07 DIAGNOSIS — Z3A20 20 weeks gestation of pregnancy: Secondary | ICD-10-CM

## 2019-11-07 DIAGNOSIS — Z3490 Encounter for supervision of normal pregnancy, unspecified, unspecified trimester: Secondary | ICD-10-CM

## 2019-11-07 NOTE — Progress Notes (Signed)
    Subjective:  Crystal Esparza is a 34 y.o. G3P2002 at [redacted]w[redacted]d being seen today for ongoing prenatal care.  She is currently monitored for the following issues for this low-risk pregnancy and has Supervision of normal pregnancy, antepartum; Maternal obesity affecting pregnancy, antepartum; Previous cesarean delivery affecting pregnancy; and Fetal abnormality in pregnancy on their problem list.  Patient reports no complaints.  Contractions: Not present. Vag. Bleeding: None.  Movement: Present. Denies leaking of fluid.   The following portions of the patient's history were reviewed and updated as appropriate: allergies, current medications, past family history, past medical history, past social history, past surgical history and problem list. Problem list updated.  Objective:   Vitals:   11/07/19 0846  BP: 107/75  Pulse: 81  Weight: 206 lb 9.6 oz (93.7 kg)    Fetal Status: Fetal Heart Rate (bpm): 148   Movement: Present     2General:  Alert, oriented and cooperative. Patient is in no acute distress.  Skin: Skin is warm and dry. No rash noted.   Cardiovascular: Normal heart rate noted  Respiratory: Normal respiratory effort, no problems with respiration noted  Abdomen: Soft, gravid, appropriate for gestational age. Pain/Pressure: Present     Pelvic:  Cervical exam deferred        Extremities: Normal range of motion.  Edema: None  Mental Status: Normal mood and affect. Normal behavior. Normal judgment and thought content.   Urinalysis:      Assessment and Plan:  Pregnancy: G3P2002 at [redacted]w[redacted]d  1. Encounter for supervision of normal pregnancy, antepartum, unspecified gravidity Doing well Needs papers for work signed - advised to bring papers to [employer] staff Does not have MyChart working - advised to discuss with Admin staff at check out Plans C/S birth and does want to have another child.  - AFP, Serum, Open Spina Bifida  2. Fetal atypical findings  Fetal pyeclectasis noted  bilaterally and follow up ultrasound scheduled   Sex chromosome of the baby unable to be identified through NIPS genetic screening - discussed with genetics counselor.  COVID-19 Vaccine Counseling: The patient was counseled on the potential benefits and lack of known risks of COVID vaccination, during pregnancy and breastfeeding, during today's visit. The patient's questions and concerns were addressed today, including safety of the vaccination and potential side effects as they have been published by ACOG and SMFM. The patient has been informed that there have not been any documented vaccine related injuries, deaths or birth defects to infant or mom after receiving the COVID-19 vaccine to date. The patient has been made aware that although she is not at increased risk of contracting COVID-19 during pregnancy, she is at increased risk of developing severe disease and complications if she contracts COVID-19 while pregnant. All patient questions were addressed during our visit today. The patient is not planning to get vaccinated at this time.    Preterm labor symptoms and general obstetric precautions including but not limited to vaginal bleeding, contractions, leaking of fluid and fetal movement were reviewed in detail with the patient. Please refer to After Visit Summary for other counseling recommendations.  Return in about 4 weeks (around 12/05/2019) for in person ROB.  Nolene Bernheim, RN, MSN, NP-BC Nurse Practitioner, [facility] for [employer], [facility] 11/07/2019 10:18 AM

## 2019-11-07 NOTE — Progress Notes (Signed)
Patient in office today, reports fetal movement and pressure.

## 2019-11-09 LAB — AFP, SERUM, OPEN SPINA BIFIDA
AFP MoM: 1.03
AFP Value: 54.3 ng/mL
Gest. Age on Collection Date: 20 weeks
Maternal Age At EDD: 33.2 yr
OSBR Risk 1 IN: 10000
Test Results:: NEGATIVE
Weight: 206 [lb_av]

## 2019-11-12 ENCOUNTER — Inpatient Hospital Stay (HOSPITAL_COMMUNITY)
Admission: AD | Admit: 2019-11-12 | Discharge: 2019-11-12 | Disposition: A | Discharge status: Home / Self Care | Payer: Medicaid Other | Attending: Obstetrics & Gynecology | Admitting: Obstetrics & Gynecology

## 2019-11-12 ENCOUNTER — Other Ambulatory Visit: Payer: Self-pay

## 2019-11-12 DIAGNOSIS — M545 Low back pain: Secondary | ICD-10-CM | POA: Diagnosis not present

## 2019-11-12 DIAGNOSIS — O99891 Other specified diseases and conditions complicating pregnancy: Secondary | ICD-10-CM

## 2019-11-12 DIAGNOSIS — Z3492 Encounter for supervision of normal pregnancy, unspecified, second trimester: Secondary | ICD-10-CM

## 2019-11-12 DIAGNOSIS — R109 Unspecified abdominal pain: Secondary | ICD-10-CM | POA: Diagnosis not present

## 2019-11-12 DIAGNOSIS — F17200 Nicotine dependence, unspecified, uncomplicated: Secondary | ICD-10-CM | POA: Insufficient documentation

## 2019-11-12 DIAGNOSIS — O34219 Maternal care for unspecified type scar from previous cesarean delivery: Secondary | ICD-10-CM

## 2019-11-12 DIAGNOSIS — Z3A20 20 weeks gestation of pregnancy: Secondary | ICD-10-CM

## 2019-11-12 DIAGNOSIS — O26892 Other specified pregnancy related conditions, second trimester: Secondary | ICD-10-CM | POA: Insufficient documentation

## 2019-11-12 DIAGNOSIS — R197 Diarrhea, unspecified: Secondary | ICD-10-CM | POA: Diagnosis not present

## 2019-11-12 DIAGNOSIS — Z79899 Other long term (current) drug therapy: Secondary | ICD-10-CM | POA: Insufficient documentation

## 2019-11-12 DIAGNOSIS — O99332 Smoking (tobacco) complicating pregnancy, second trimester: Secondary | ICD-10-CM | POA: Diagnosis not present

## 2019-11-12 DIAGNOSIS — O99210 Obesity complicating pregnancy, unspecified trimester: Secondary | ICD-10-CM

## 2019-11-12 DIAGNOSIS — Z7982 Long term (current) use of aspirin: Secondary | ICD-10-CM | POA: Diagnosis not present

## 2019-11-12 LAB — URINALYSIS, ROUTINE W REFLEX MICROSCOPIC
Bilirubin Urine: NEGATIVE
Glucose, UA: NEGATIVE mg/dL
Hgb urine dipstick: NEGATIVE
Ketones, ur: NEGATIVE mg/dL
Nitrite: NEGATIVE
Protein, ur: NEGATIVE mg/dL
Specific Gravity, Urine: 1.023 (ref 1.005–1.030)
pH: 6 (ref 5.0–8.0)

## 2019-11-12 MED ORDER — LOPERAMIDE HCL 2 MG PO CAPS: 2.0000 mg | ORAL_CAPSULE | Freq: Four times a day (QID) | ORAL | 0 refills | Status: AC | PRN

## 2019-11-12 MED ORDER — LOPERAMIDE HCL 2 MG PO CAPS
2.0000 mg | ORAL_CAPSULE | Freq: Once | ORAL | Status: AC
  Administered 2019-11-12: 2 mg via ORAL
  Filled 2019-11-12: qty 1

## 2019-11-12 NOTE — MAU Provider Note (Signed)
History     CSN: [id number]  Arrival date and time: 11/12/19 0110   First Provider Initiated Contact with Patient 11/12/19 0141      Chief Complaint  Patient presents with  . Diarrhea  . Abdominal Pain  . Back Pain   HPI Crystal Esparza is a 34 y.o. G3P2002 at 74w5dwho presents to MAU with chief complaint of diarrhea, new onset about three days ago. Patient endorses 7 episodes of loose stools in the past day but feels her symptoms are getting better over time. She also c/o abdominal "cramps" and low back pain, onset coinciding with onset of diarrhea. Patient end[facility] before symptom onset.  She denies vaginal bleeding, leaking of fluid, decreased fetal movement, fever, falls, or recent illness.   [facility] [place] [facility]  OB History    Gravida  3   Para  2   Term  2   Preterm      AB      Living  2     SAB      TAB      Ectopic      Multiple      Live Births  2           Past Medical History:  Diagnosis Date  . Depression   . Vaginal Pap smear, abnormal     Past Surgical History:  Procedure Laterality Date  . CESAREAN SECTION      Family History  Problem Relation Age of Onset  . Cancer Mother   . ADD / ADHD Son   . Cancer Maternal Aunt   . Cancer Paternal Grandmother     Social History   Tobacco Use  . Smoking status: Current Some Day Smoker  . Smokeless tobacco: Never Used  Vaping Use  . Vaping Use: Never used  Substance Use Topics  . Alcohol use: Never  . Drug use: Never    Allergies: No Known Allergies  Medications Prior to Admission  Medication Sig Dispense Refill Last Dose  . aspirin EC 81 MG tablet Take 1 tablet (81 mg total) by mouth daily. Take after 12 weeks for prevention of preeclampsia later in pregnancy 300 tablet 2   . Blood Pressure Monitoring (BLOOD PRESSURE KIT) DEVI 1 kit by Does not apply route once a week. Check Blood Pressure regularly and record  readings into the [employer] App.  Large Cuff.  DX O90.0 1 each 0   . metroNIDAZOLE (FLAGYL) 500 MG tablet Take 1 tablet (500 mg total) by mouth 2 (two) times daily. (Patient not taking: Reported on 11/07/2019) 14 tablet 0   . Prenatal Vit-Fe Fumarate-FA (PRENATAL MULTIVITAMIN) TABS tablet Take 1 tablet by mouth daily at 12 noon.       Review of Systems  Gastrointestinal: Positive for abdominal pain and diarrhea.  Genitourinary: Positive for vaginal bleeding.  All other systems reviewed and are negative.  Physical Exam   Blood pressure 107/71, pulse 85, temperature 98.8 F (37.1 C), resp. rate 17, height '5\' 6"'  (1.676 m), weight 94.3 kg, last menstrual period 05/24/2019, SpO2 100 %.  Physical Exam Vitals and nursing note reviewed. Exam conducted with a chaperone present.  Constitutional:      Appearance: She is not ill-appearing.  Cardiovascular:     Rate and Rhythm: Normal rate.     Heart sounds: Normal heart sounds.  Pulmonary:     Effort: Pulmonary effort is normal.  Breath sounds: Normal breath sounds.  Abdominal:     Tenderness: There is no abdominal tenderness.     Comments: Gravid  Genitourinary:    Vagina: Normal.     Cervix: Normal.     Uterus: Normal.      Comments: Pelvic exam: External genitalia normal, vaginal walls pink and well rugated, cervix visually closed, no lesions noted.  Skin:    General: Skin is warm and dry.     Capillary Refill: Capillary refill takes less than 2 seconds.  Psychiatric:        Mood and Affect: Mood normal.        Behavior: Behavior normal.     MAU Course  Procedures: speculum exam  Orders Placed This Encounter  Procedures  . Urinalysis, Routine w reflex microscopic Urine, Clean Catch   Patient Vitals for the past 24 hrs:  BP Temp Pulse Resp SpO2 Height Weight  11/12/19 0248 107/65 -- 77 16 100 % -- --  11/12/19 0119 107/71 98.8 F (37.1 C) 85 17 100 % '5\' 6"'  (1.676 m) 94.3 kg   Results for orders placed or performed  during the hospital encounter of 11/12/19 (from the past 24 hour(s))  Urinalysis, Routine w reflex microscopic Urine, Clean Catch     Status: Abnormal   Collection Time: 11/12/19  1:25 AM  Result Value Ref Range   Color, Urine YELLOW YELLOW   APPearance HAZY (A) CLEAR   Specific Gravity, Urine 1.023 1.005 - 1.030   pH 6.0 5.0 - 8.0   Glucose, UA NEGATIVE NEGATIVE mg/dL   Hgb urine dipstick NEGATIVE NEGATIVE   Bilirubin Urine NEGATIVE NEGATIVE   Ketones, ur NEGATIVE NEGATIVE mg/dL   Protein, ur NEGATIVE NEGATIVE mg/dL   Nitrite NEGATIVE NEGATIVE   Leukocytes,Ua TRACE (A) NEGATIVE   RBC / HPF 0-5 0 - 5 RBC/hpf   WBC, UA 0-5 0 - 5 WBC/hpf   Bacteria, UA RARE (A) NONE SEEN   Squamous Epithelial / LPF 11-20 0 - 5   Mucus PRESENT    Meds ordered this encounter  Medications  . loperamide (IMODIUM) capsule 2 mg  . loperamide (IMODIUM) 2 MG capsule    Sig: Take 1 capsule (2 mg total) by mouth 4 (four) times daily as needed for diarrhea or loose stools.    Dispense:  12 capsule    Refill:  0    Order Specific Question:   Supervising Provider    Answer:   Florian Buff [[id number]]    Assessment and Plan  --34 y.o. G3P2002 at [redacted]w[redacted]d --[id number] by Doppler --Closed cervix --Diarrhea, resolving without intervention --Reviewed supportive care --Discharge home in stable condition  SDarlina Rumpf CNM 11/12/2019, 3:38 AM

## 2019-11-12 NOTE — Discharge Instructions (Signed)
Diarrhea, Adult Diarrhea is when you pass loose and watery poop (stool) often. Diarrhea can make you feel weak and cause you to lose water in your body (get dehydrated). Losing water in your body can cause you to:  Feel tired and thirsty.  Have a dry mouth.  Go pee (urinate) less often. Diarrhea often lasts 2-3 days. However, it can last longer if it is a sign of something more serious. It is important to treat your diarrhea as told by your doctor. Follow these instructions at home: Eating and drinking     Follow these instructions as told by your doctor:  Take an ORS (oral rehydration solution). This is a drink that helps you replace fluids and minerals your body lost. It is sold at pharmacies and stores.  Drink plenty of fluids, such as: ? Water. ? Ice chips. ? Diluted fruit juice. ? Low-calorie sports drinks. ? Milk, if you want.  Avoid drinking fluids that have a lot of sugar or caffeine in them.  Eat bland, easy-to-digest foods in small amounts as you are able. These foods include: ? Bananas. ? Applesauce. ? Rice. ? Low-fat (lean) meats. ? Toast. ? Crackers.  Avoid alcohol.  Avoid spicy or fatty foods.  Medicines  Take over-the-counter and prescription medicines only as told by your doctor.  If you were prescribed an antibiotic medicine, take it as told by your doctor. Do not stop using the antibiotic even if you start to feel better. General instructions   Wash your hands often using soap and water. If soap and water are not available, use a hand sanitizer. Others in your home should wash their hands as well. Hands should be washed: ? After using the toilet or changing a diaper. ? Before preparing, cooking, or serving food. ? While caring for a sick person. ? While visiting someone in a hospital.  Drink enough fluid to keep your pee (urine) pale yellow.  Rest at home while you get better.  Watch your condition for any changes.  Take a warm bath to help  with any burning or pain from having diarrhea.  Keep all follow-up visits as told by your doctor. This is important. Contact a doctor if:  You have a fever.  Your diarrhea gets worse.  You have new symptoms.  You cannot keep fluids down.  You feel light-headed or dizzy.  You have a headache.  You have muscle cramps. Get help right away if:  You have chest pain.  You feel very weak or you pass out (faint).  You have bloody or black poop or poop that looks like tar.  You have very bad pain, cramping, or bloating in your belly (abdomen).  You have trouble breathing or you are breathing very quickly.  Your heart is beating very quickly.  Your skin feels cold and clammy.  You feel confused.  You have signs of losing too much water in your body, such as: ? Dark pee, very little pee, or no pee. ? Cracked lips. ? Dry mouth. ? Sunken eyes. ? Sleepiness. ? Weakness. Summary  Diarrhea is when you pass loose and watery poop (stool) often.  Diarrhea can make you feel weak and cause you to lose water in your body (get dehydrated).  Take an ORS (oral rehydration solution). This is a drink that is sold at pharmacies and stores.  Eat bland, easy-to-digest foods in small amounts as you are able.  Contact a doctor if your condition gets worse. Get help right   away if you have signs that you have lost too much water in your body. This information is not intended to replace advice given to you by your health care provider. Make sure you discuss any questions you have with your health care provider. Document Revised: 07/17/2017 Document Reviewed: 07/17/2017 Elsevier Patient Education  2020 [employer].  Safe Medications in Pregnancy   Acne: Benzoyl Peroxide Salicylic Acid  Backache/Headache: Tylenol: 2 regular strength every 4 hours OR              2 Extra strength every 6 hours  Colds/Coughs/Allergies: Benadryl (alcohol free) 25 mg every 6 hours as needed Breath  right strips Claritin Cepacol throat lozenges Chloraseptic throat spray Cold-Eeze- up to three times per day Cough drops, alcohol free Flonase (by prescription only) Guaifenesin Mucinex Robitussin DM (plain only, alcohol free) Saline nasal spray/drops Sudafed (pseudoephedrine) & Actifed ** use only after [redacted] weeks gestation and if you do not have high blood pressure Tylenol Vicks Vaporub Zinc lozenges Zyrtec   Constipation: Colace Ducolax suppositories Fleet enema Glycerin suppositories Metamucil Milk of magnesia Miralax Senokot Smooth move tea  Diarrhea: Kaopectate Imodium A-D  *NO pepto Bismol  Hemorrhoids: Anusol Anusol HC Preparation H Tucks  Indigestion: Tums Maalox Mylanta Zantac  Pepcid  Insomnia: Benadryl (alcohol free) 25mg  every 6 hours as needed Tylenol PM Unisom, no Gelcaps  Leg Cramps: Tums MagGel  Nausea/Vomiting:  Bonine Dramamine Emetrol Ginger extract Sea bands Meclizine  Nausea medication to take during pregnancy:  Unisom (doxylamine succinate 25 mg tablets) Take one tablet daily at bedtime. If symptoms are not adequately controlled, the dose can be increased to a maximum recommended dose of two tablets daily (1/2 tablet in the morning, 1/2 tablet mid-afternoon and one at bedtime). Vitamin B6 100mg  tablets. Take one tablet twice a day (up to 200 mg per day).  Skin Rashes: Aveeno products Benadryl cream or 25mg  every 6 hours as needed Calamine Lotion 1% cortisone cream  Yeast infection: Gyne-lotrimin 7 Monistat 7   **If taking multiple medications, please check labels to avoid duplicating the same active ingredients **take medication as directed on the label ** Do not exceed 4000 mg of tylenol in 24 hours **Do not take medications that contain aspirin or ibuprofen

## 2019-11-12 NOTE — MAU Note (Signed)
Pt reports she has had diarrhea x 3 days (7 stools today). Lower abd cramping and lower back pain. Reports decreased fetal movement. Pt denies fever/nausea/vomiting.

## 2019-12-01 ENCOUNTER — Ambulatory Visit: Payer: Medicaid Other | Attending: Obstetrics and Gynecology

## 2019-12-01 ENCOUNTER — Ambulatory Visit: Payer: Medicaid Other | Admitting: *Deleted

## 2019-12-01 ENCOUNTER — Other Ambulatory Visit: Payer: Self-pay | Admitting: *Deleted

## 2019-12-01 ENCOUNTER — Other Ambulatory Visit: Payer: Self-pay

## 2019-12-01 DIAGNOSIS — O99210 Obesity complicating pregnancy, unspecified trimester: Secondary | ICD-10-CM | POA: Diagnosis present

## 2019-12-01 DIAGNOSIS — O280 Abnormal hematological finding on antenatal screening of mother: Secondary | ICD-10-CM | POA: Insufficient documentation

## 2019-12-01 DIAGNOSIS — O283 Abnormal ultrasonic finding on antenatal screening of mother: Secondary | ICD-10-CM | POA: Diagnosis not present

## 2019-12-01 DIAGNOSIS — O99212 Obesity complicating pregnancy, second trimester: Secondary | ICD-10-CM

## 2019-12-01 DIAGNOSIS — O321XX0 Maternal care for breech presentation, not applicable or unspecified: Secondary | ICD-10-CM | POA: Diagnosis not present

## 2019-12-01 DIAGNOSIS — Z3A23 23 weeks gestation of pregnancy: Secondary | ICD-10-CM

## 2019-12-01 DIAGNOSIS — Z362 Encounter for other antenatal screening follow-up: Secondary | ICD-10-CM

## 2019-12-01 DIAGNOSIS — O289 Unspecified abnormal findings on antenatal screening of mother: Secondary | ICD-10-CM

## 2019-12-01 DIAGNOSIS — O358XX0 Maternal care for other (suspected) fetal abnormality and damage, not applicable or unspecified: Secondary | ICD-10-CM | POA: Diagnosis not present

## 2019-12-01 DIAGNOSIS — Z148 Genetic carrier of other disease: Secondary | ICD-10-CM

## 2019-12-01 DIAGNOSIS — O34219 Maternal care for unspecified type scar from previous cesarean delivery: Secondary | ICD-10-CM | POA: Diagnosis not present

## 2019-12-05 ENCOUNTER — Other Ambulatory Visit: Payer: Self-pay

## 2019-12-05 ENCOUNTER — Ambulatory Visit (INDEPENDENT_AMBULATORY_CARE_PROVIDER_SITE_OTHER): Payer: Medicaid Other | Admitting: Advanced Practice Midwife

## 2019-12-05 ENCOUNTER — Encounter: Payer: Self-pay | Admitting: Advanced Practice Midwife

## 2019-12-05 VITALS — BP 121/84 | HR 96 | Wt 208.6 lb

## 2019-12-05 DIAGNOSIS — O35EXX0 Maternal care for other (suspected) fetal abnormality and damage, fetal genitourinary anomalies, not applicable or unspecified: Secondary | ICD-10-CM

## 2019-12-05 DIAGNOSIS — Z3A24 24 weeks gestation of pregnancy: Secondary | ICD-10-CM

## 2019-12-05 DIAGNOSIS — O34219 Maternal care for unspecified type scar from previous cesarean delivery: Secondary | ICD-10-CM

## 2019-12-05 DIAGNOSIS — O358XX0 Maternal care for other (suspected) fetal abnormality and damage, not applicable or unspecified: Secondary | ICD-10-CM

## 2019-12-05 DIAGNOSIS — Z3490 Encounter for supervision of normal pregnancy, unspecified, unspecified trimester: Secondary | ICD-10-CM

## 2019-12-05 NOTE — Progress Notes (Signed)
   PRENATAL VISIT NOTE  Subjective:  Crystal Esparza is a 34 y.o. G3P2002 at [redacted]w[redacted]d being seen today for ongoing prenatal care.  She is currently monitored for the following issues for this low-risk pregnancy and has Supervision of normal pregnancy, antepartum; Maternal obesity affecting pregnancy, antepartum; Previous cesarean delivery affecting pregnancy; and Fetal abnormality in pregnancy on their problem list.  Patient reports no complaints.  Contractions: Irritability. Vag. Bleeding: None.  Movement: Present. Denies leaking of fluid.   The following portions of the patient's history were reviewed and updated as appropriate: allergies, current medications, past family history, past medical history, past social history, past surgical history and problem list.   Objective:   Vitals:   12/05/19 0843  BP: 121/84  Pulse: 96  Weight: 208 lb 9.6 oz (94.6 kg)    Fetal Status: Fetal Heart Rate (bpm): 141   Movement: Present     General:  Alert, oriented and cooperative. Patient is in no acute distress.  Skin: Skin is warm and dry. No rash noted.   Cardiovascular: Normal heart rate noted  Respiratory: Normal respiratory effort, no problems with respiration noted  Abdomen: Soft, gravid, appropriate for gestational age.  Pain/Pressure: Present     Pelvic: Cervical exam deferred        Extremities: Normal range of motion.  Edema: None  Mental Status: Normal mood and affect. Normal behavior. Normal judgment and thought content.   Assessment and Plan:  Pregnancy: G3P2002 at [redacted]w[redacted]d  1. Previous cesarean delivery affecting pregnancy --Previous C/S x 2, plans repeat  2. Encounter for supervision of normal pregnancy, antepartum, unspecified gravidity --Anticipatory guidance about next visits/weeks of pregnancy given. --next visit in 4 weeks for GTT   3. [redacted] weeks gestation of pregnancy   4. Pyelectasis of fetus on prenatal ultrasound --Discussed UTD results with pt, reassurance provided. F/U  US scheduled with MFM.  Preterm labor symptoms and general obstetric precautions including but not limited to vaginal bleeding, contractions, leaking of fluid and fetal movement were reviewed in detail with the patient. Please refer to After Visit Summary for other counseling recommendations.   Return in about 4 weeks (around 01/02/2020).  Future Appointments  Date Time Provider Department Center  12/29/2019 10:30 AM [facility] NURSE [facility]  12/29/2019 10:45 AM [facility] US4 [facility]  01/02/2020  8:30 AM [facility] LAB [facility] None  01/02/2020  8:55 AM Leftwich-Kirby, Wilmer Floor, CNM [facility] None    Sharen Counter, CNM

## 2019-12-05 NOTE — Patient Instructions (Addendum)

## 2019-12-05 NOTE — Progress Notes (Signed)
Pt presents for ROB has questions about "maternity obesity diagnosis" in Mychart and EDD questions.

## 2019-12-29 ENCOUNTER — Ambulatory Visit: Payer: Medicaid Other | Admitting: *Deleted

## 2019-12-29 ENCOUNTER — Ambulatory Visit: Payer: Medicaid Other | Attending: Obstetrics and Gynecology

## 2019-12-29 ENCOUNTER — Encounter: Payer: Self-pay | Admitting: *Deleted

## 2019-12-29 ENCOUNTER — Other Ambulatory Visit: Payer: Self-pay | Admitting: *Deleted

## 2019-12-29 ENCOUNTER — Other Ambulatory Visit: Payer: Self-pay

## 2019-12-29 DIAGNOSIS — O283 Abnormal ultrasonic finding on antenatal screening of mother: Secondary | ICD-10-CM | POA: Diagnosis not present

## 2019-12-29 DIAGNOSIS — O99212 Obesity complicating pregnancy, second trimester: Secondary | ICD-10-CM | POA: Diagnosis not present

## 2019-12-29 DIAGNOSIS — O34219 Maternal care for unspecified type scar from previous cesarean delivery: Secondary | ICD-10-CM | POA: Diagnosis not present

## 2019-12-29 DIAGNOSIS — Z3A27 27 weeks gestation of pregnancy: Secondary | ICD-10-CM

## 2019-12-29 DIAGNOSIS — O289 Unspecified abnormal findings on antenatal screening of mother: Secondary | ICD-10-CM

## 2019-12-29 DIAGNOSIS — Z362 Encounter for other antenatal screening follow-up: Secondary | ICD-10-CM | POA: Diagnosis not present

## 2019-12-29 DIAGNOSIS — O99210 Obesity complicating pregnancy, unspecified trimester: Secondary | ICD-10-CM | POA: Insufficient documentation

## 2019-12-29 DIAGNOSIS — N1330 Unspecified hydronephrosis: Secondary | ICD-10-CM

## 2019-12-29 DIAGNOSIS — E669 Obesity, unspecified: Secondary | ICD-10-CM

## 2020-01-02 ENCOUNTER — Encounter: Payer: Medicaid Other | Admitting: Advanced Practice Midwife

## 2020-01-02 ENCOUNTER — Other Ambulatory Visit: Payer: Medicaid Other

## 2020-01-10 ENCOUNTER — Other Ambulatory Visit: Payer: Self-pay

## 2020-01-10 ENCOUNTER — Inpatient Hospital Stay (HOSPITAL_COMMUNITY)
Admit: 2020-01-10 | Discharge: 2020-01-10 | Disposition: A | Discharge status: Home / Self Care | Payer: Medicaid Other | Attending: Obstetrics and Gynecology | Admitting: Obstetrics and Gynecology

## 2020-01-10 ENCOUNTER — Encounter (HOSPITAL_COMMUNITY): Payer: Self-pay | Admitting: Obstetrics and Gynecology

## 2020-01-10 DIAGNOSIS — N898 Other specified noninflammatory disorders of vagina: Secondary | ICD-10-CM

## 2020-01-10 DIAGNOSIS — O26893 Other specified pregnancy related conditions, third trimester: Secondary | ICD-10-CM

## 2020-01-10 DIAGNOSIS — Z3A29 29 weeks gestation of pregnancy: Secondary | ICD-10-CM | POA: Diagnosis not present

## 2020-01-10 DIAGNOSIS — O99333 Smoking (tobacco) complicating pregnancy, third trimester: Secondary | ICD-10-CM | POA: Insufficient documentation

## 2020-01-10 DIAGNOSIS — F17200 Nicotine dependence, unspecified, uncomplicated: Secondary | ICD-10-CM | POA: Insufficient documentation

## 2020-01-10 DIAGNOSIS — O4703 False labor before 37 completed weeks of gestation, third trimester: Secondary | ICD-10-CM | POA: Diagnosis not present

## 2020-01-10 DIAGNOSIS — O99891 Other specified diseases and conditions complicating pregnancy: Secondary | ICD-10-CM | POA: Diagnosis not present

## 2020-01-10 DIAGNOSIS — O34219 Maternal care for unspecified type scar from previous cesarean delivery: Secondary | ICD-10-CM

## 2020-01-10 DIAGNOSIS — O99210 Obesity complicating pregnancy, unspecified trimester: Secondary | ICD-10-CM

## 2020-01-10 DIAGNOSIS — N858 Other specified noninflammatory disorders of uterus: Secondary | ICD-10-CM

## 2020-01-10 LAB — URINALYSIS, ROUTINE W REFLEX MICROSCOPIC
Bilirubin Urine: NEGATIVE
Glucose, UA: NEGATIVE mg/dL
Hgb urine dipstick: NEGATIVE
Ketones, ur: NEGATIVE mg/dL
Nitrite: NEGATIVE
Protein, ur: NEGATIVE mg/dL
Specific Gravity, Urine: 1.013 (ref 1.005–1.030)
pH: 6 (ref 5.0–8.0)

## 2020-01-10 LAB — FETAL FIBRONECTIN: Fetal Fibronectin: NEGATIVE

## 2020-01-10 MED ORDER — NIFEDIPINE 10 MG PO CAPS
10.0000 mg | ORAL_CAPSULE | ORAL | Status: DC | PRN
  Administered 2020-01-10: 10 mg via ORAL
  Filled 2020-01-10: qty 1

## 2020-01-10 NOTE — MAU Note (Signed)
pt stated she has been having mild ctx since yesterday  About 1/hr. Today not as often about 4 times all day but they where strong. Pt also reports a gush of clear fluid at 5pm and then  2 more times since. Good fetal movement reported.

## 2020-01-10 NOTE — MAU Provider Note (Signed)
Chief Complaint:  Contractions and Vaginal Discharge   First Provider Initiated Contact with Patient 01/10/20 2127     HPI: Crystal Esparza is a 34 y.o. G3P2002 at 39w1dho presents to maternity admissions reporting Leaking of fluid (not sure if it is urine), and intermittent contractions.   She reports good fetal movement, denies vaginal bleeding, vaginal itching/burning, urinary symptoms, h/a, dizziness, n/v, diarrhea, constipation or fever/chills.   Vaginal Discharge The patient's primary symptoms include pelvic pain (intermittent cramping) and vaginal discharge (3 small gushes). The patient's pertinent negatives include no genital itching, genital lesions, genital odor or vaginal bleeding. The current episode started today. The problem occurs intermittently. The problem has been resolved. The pain is mild. She is pregnant. Pertinent negatives include no chills, constipation, diarrhea, dysuria, fever, frequency, nausea or vomiting. The vaginal discharge was clear and watery. There has been no bleeding. She has not been passing clots. She has not been passing tissue. Nothing aggravates the symptoms. She has tried nothing for the symptoms.    RN Note: pt stated she has been having mild ctx since yesterday  About 1/hr. Today not as often about 4 times all day but they where strong. Pt also reports a gush of clear fluid at 5pm and then  2 more times since. Good fetal movement reported   Past Medical History: Past Medical History:  Diagnosis Date   Depression    Vaginal Pap smear, abnormal     Past obstetric history: OB History  Gravida Para Term Preterm AB Living  _0 SAB TAB Ectopic Multiple Live Births          2    # Outcome Date GA Lbr Len/2nd Weight Sex Delivery Anes PTL Lv  3 Current           2 Term 07/30/09     CS-LTranv   LIV  1 Term      CS-LTranv   LIV    Past Surgical History: Past Surgical History:  Procedure Laterality Date   CESAREAN SECTION       Family History: Family History  Problem Relation Age of Onset   Cancer Mother    ADD / ADHD Son    Cancer Maternal Aunt    Cancer Paternal Grandmother     Social History: Social History   Tobacco Use   Smoking status: Current Some Day Smoker   Smokeless tobacco: Never Used  VScientific laboratory technicianUse: Never used  Substance Use Topics   Alcohol use: Never   Drug use: Never    Allergies: No Known Allergies  Meds:  Medications Prior to Admission  Medication Sig Dispense Refill Last Dose   aspirin EC 81 MG tablet Take 1 tablet (81 mg total) by mouth daily. Take after 12 weeks for prevention of preeclampsia later in pregnancy 300 tablet 2    Blood Pressure Monitoring (BLOOD PRESSURE KIT) DEVI 1 kit by Does not apply route once a week. Check Blood Pressure regularly and record readings into the [employer] App.  Large Cuff.  DX O90.0 1 each 0    loperamide (IMODIUM) 2 MG capsule Take 1 capsule (2 mg total) by mouth 4 (four) times daily as needed for diarrhea or loose stools. (Patient not taking: Reported on 12/05/2019) 12 capsule 0    metroNIDAZOLE (FLAGYL) 500 MG tablet Take 1 tablet (500 mg total) by mouth 2 (two) times daily. (Patient not taking: Reported on 11/07/2019) 14 tablet 0  Prenatal Vit-Fe Fumarate-FA (PRENATAL MULTIVITAMIN) TABS tablet Take 1 tablet by mouth daily at 12 noon.       I have reviewed patient's Past Medical Hx, Surgical Hx, Family Hx, Social Hx, medications and allergies.   ROS:  Review of Systems  Constitutional: Negative for chills and fever.  Gastrointestinal: Negative for constipation, diarrhea, nausea and vomiting.  Genitourinary: Positive for pelvic pain (intermittent cramping) and vaginal discharge (3 small gushes). Negative for dysuria and frequency.   Other systems negative  Physical Exam   Patient Vitals for the past 24 hrs:  BP Temp Pulse Resp Height Weight  01/10/20 2123 114/69 98.5 F (36.9 C) 94 18 5' 6" (1.676 m) 98  kg   Constitutional: Well-developed, well-nourished female in no acute distress.  Cardiovascular: normal rate and rhythm Respiratory: normal effort, clear to auscultation bilaterally GI: Abd soft, non-tender, gravid appropriate for gestational age.   No rebound or guarding. MS: Extremities nontender, no edema, normal ROM Neurologic: Alert and oriented x 4.  GU: Neg CVAT.  PELVIC EXAM: Cervix pink, visually closed, without lesion, moderate thick white creamy discharge, vaginal walls and external genitalia normal    Cervix long and closed  FHT:  Baseline 130 , moderate variability, accelerations present, no decelerations Contractions: uterine irritability    Labs: Results for orders placed or performed during the hospital encounter of 01/10/20 (from the past 24 hour(s))  Urinalysis, Routine w reflex microscopic     Status: Abnormal   Collection Time: 01/10/20  9:26 PM  Result Value Ref Range   Color, Urine YELLOW YELLOW   APPearance HAZY (A) CLEAR   Specific Gravity, Urine 1.013 1.005 - 1.030   pH 6.0 5.0 - 8.0   Glucose, UA NEGATIVE NEGATIVE mg/dL   Hgb urine dipstick NEGATIVE NEGATIVE   Bilirubin Urine NEGATIVE NEGATIVE   Ketones, ur NEGATIVE NEGATIVE mg/dL   Protein, ur NEGATIVE NEGATIVE mg/dL   Nitrite NEGATIVE NEGATIVE   Leukocytes,Ua TRACE (A) NEGATIVE   RBC / HPF 0-5 0 - 5 RBC/hpf   WBC, UA 0-5 0 - 5 WBC/hpf   Bacteria, UA RARE (A) NONE SEEN   Squamous Epithelial / LPF 6-10 0 - 5   Mucus PRESENT   Fetal fibronectin     Status: None   Collection Time: 01/10/20  9:49 PM  Result Value Ref Range   Fetal Fibronectin NEGATIVE NEGATIVE    O/Positive/-- (07/19 1219)  Imaging:    MAU Course/MDM: I have ordered labs and reviewed results. UA is clear and FFn is negative. Reviewed this with patient, reassured that she is not likely to have preterm delivery in the next 2 weeks.  Discussed gushes were likely urine, no evidence for PPROM NST reviewed, reactive, category  I  Treatments in MAU included Procardia series (1 dose).    Assessment: Single IUP at 5w2dUterine irritability  Vaginal discharge, no evidence of ruptured membranes  Plan: Discharge home Preterm Labor precautions and fetal kick counts Follow up in Office for prenatal visits   Encouraged to return here if she develops worsening of symptoms, increase in pain, fever, or other concerning symptoms.   Pt stable at time of discharge.  MHansel FeinsteinCNM, MSN Certified Nurse-Midwife 01/10/2020 9:28 PM

## 2020-01-10 NOTE — Discharge Instructions (Signed)

## 2020-01-25 ENCOUNTER — Encounter: Payer: Medicaid Other | Admitting: Obstetrics and Gynecology

## 2020-01-25 ENCOUNTER — Other Ambulatory Visit: Payer: Medicaid Other

## 2020-02-02 ENCOUNTER — Encounter: Payer: Self-pay | Admitting: Obstetrics

## 2020-02-02 ENCOUNTER — Encounter: Payer: Self-pay | Admitting: *Deleted

## 2020-02-02 ENCOUNTER — Ambulatory Visit: Payer: Medicaid Other | Attending: Obstetrics and Gynecology

## 2020-02-02 ENCOUNTER — Ambulatory Visit (INDEPENDENT_AMBULATORY_CARE_PROVIDER_SITE_OTHER): Payer: Medicaid Other | Admitting: Obstetrics

## 2020-02-02 ENCOUNTER — Ambulatory Visit: Payer: Medicaid Other | Admitting: *Deleted

## 2020-02-02 ENCOUNTER — Other Ambulatory Visit: Payer: Self-pay

## 2020-02-02 VITALS — BP 110/76 | HR 101 | Wt 218.0 lb

## 2020-02-02 DIAGNOSIS — M549 Dorsalgia, unspecified: Secondary | ICD-10-CM

## 2020-02-02 DIAGNOSIS — Z3A32 32 weeks gestation of pregnancy: Secondary | ICD-10-CM

## 2020-02-02 DIAGNOSIS — O34219 Maternal care for unspecified type scar from previous cesarean delivery: Secondary | ICD-10-CM | POA: Diagnosis not present

## 2020-02-02 DIAGNOSIS — R12 Heartburn: Secondary | ICD-10-CM

## 2020-02-02 DIAGNOSIS — O99210 Obesity complicating pregnancy, unspecified trimester: Secondary | ICD-10-CM

## 2020-02-02 DIAGNOSIS — E669 Obesity, unspecified: Secondary | ICD-10-CM

## 2020-02-02 DIAGNOSIS — O99213 Obesity complicating pregnancy, third trimester: Secondary | ICD-10-CM | POA: Diagnosis not present

## 2020-02-02 DIAGNOSIS — O26899 Other specified pregnancy related conditions, unspecified trimester: Secondary | ICD-10-CM

## 2020-02-02 DIAGNOSIS — O359XX0 Maternal care for (suspected) fetal abnormality and damage, unspecified, not applicable or unspecified: Secondary | ICD-10-CM

## 2020-02-02 DIAGNOSIS — O358XX0 Maternal care for other (suspected) fetal abnormality and damage, not applicable or unspecified: Secondary | ICD-10-CM | POA: Diagnosis not present

## 2020-02-02 DIAGNOSIS — O289 Unspecified abnormal findings on antenatal screening of mother: Secondary | ICD-10-CM

## 2020-02-02 DIAGNOSIS — O4703 False labor before 37 completed weeks of gestation, third trimester: Secondary | ICD-10-CM | POA: Diagnosis not present

## 2020-02-02 DIAGNOSIS — N1330 Unspecified hydronephrosis: Secondary | ICD-10-CM | POA: Diagnosis present

## 2020-02-02 DIAGNOSIS — Z362 Encounter for other antenatal screening follow-up: Secondary | ICD-10-CM

## 2020-02-02 DIAGNOSIS — Z3490 Encounter for supervision of normal pregnancy, unspecified, unspecified trimester: Secondary | ICD-10-CM

## 2020-02-02 MED ORDER — OMEPRAZOLE 20 MG PO CPDR: 20.0000 mg | DELAYED_RELEASE_CAPSULE | Freq: Two times a day (BID) | ORAL | 5 refills | Status: AC

## 2020-02-02 MED ORDER — COMFORT FIT MATERNITY SUPP LG MISC: 1.0000 | Freq: Once | 0 refills | Status: AC

## 2020-02-02 NOTE — Progress Notes (Signed)
Subjective:  Crystal Esparza is a 34 y.o. G3P2002 at [redacted]w[redacted]d being seen today for ongoing prenatal care.  She is currently monitored for the following issues for this low-risk pregnancy and has Supervision of normal pregnancy, antepartum; Maternal obesity affecting pregnancy, antepartum; Previous cesarean delivery affecting pregnancy; and Fetal abnormality in pregnancy on their problem list.  Patient reports backache.  Contractions: Irregular. Vag. Bleeding: None.  Movement: Present. Denies leaking of fluid.   The following portions of the patient's history were reviewed and updated as appropriate: allergies, current medications, past family history, past medical history, past social history, past surgical history and problem list. Problem list updated.  Objective:   Vitals:   02/02/20 1331  BP: 110/76  Pulse: (!) 101  Weight: 218 lb (98.9 kg)    Fetal Status:     Movement: Present     General:  Alert, oriented and cooperative. Patient is in no acute distress.  Skin: Skin is warm and dry. No rash noted.   Cardiovascular: Normal heart rate noted  Respiratory: Normal respiratory effort, no problems with respiration noted  Abdomen: Soft, gravid, appropriate for gestational age. Pain/Pressure: Present     Pelvic:  Cervical exam performed      Long / closed / -3 / Vtx  Extremities: Normal range of motion.     Mental Status: Normal mood and affect. Normal behavior. Normal judgment and thought content.   Urinalysis:      Assessment and Plan:  Pregnancy: G3P2002 at [redacted]w[redacted]d  1. Encounter for supervision of normal pregnancy, antepartum, unspecified gravidity  2. Previous cesarean delivery x 2 affecting pregnancy  3. Preterm uterine contractions in third trimester, antepartum - EFM: Reactive NST.  Good variability.  Occasional mild UC.  15x15 accels.  No decels  4. Fetal abnormality affecting management of mother, single or unspecified fetus - SMA carrier.  Genetic counseling done.  Patient  refused further testing.  5. Obesity affecting pregnancy, antepartum  6. Backache symptom Rx: - Elastic Bandages & Supports (COMFORT FIT MATERNITY SUPP LG) MISC; 1 each .  Wear as directed.  Dispense: 1 each; Refill: 0  7. Heartburn during pregnancy, antepartum Rx: - omeprazole (PRILOSEC) 20 MG capsule; Take 1 capsule (20 mg total) by mouth 2 (two) times daily before a meal.  Dispense: 60 capsule; Refill: 5   Preterm labor symptoms and general obstetric precautions including but not limited to vaginal bleeding, contractions, leaking of fluid and fetal movement were reviewed in detail with the patient. Please refer to After Visit Summary for other counseling recommendations.   Return in about 1 week (around 02/09/2020) for ROB.   Brock Bad, MD  02/02/20

## 2020-02-02 NOTE — Addendum Note (Signed)
Addended by: Marya Landry D on: 02/02/2020 04:26 PM   Modules accepted: Orders

## 2020-02-02 NOTE — Progress Notes (Signed)
Pt is having increase in pelvic and back pain with irreg ctx.  Pt was seen at [facility] in [place] on Friday for this pain. Pt states she was fingertip.

## 2020-02-02 NOTE — Progress Notes (Signed)
States"seen at hospital in [place] on 01/27/20 for contractions, vag exam FT dilated."

## 2020-02-29 ENCOUNTER — Encounter: Payer: Medicaid Other | Admitting: Obstetrics and Gynecology

## 2020-07-25 ENCOUNTER — Ambulatory Visit (HOSPITAL_COMMUNITY): Payer: Medicaid Other

## 2021-11-13 IMAGING — US US OB < 14 WEEKS - US OB TV
1 series · 15 of 28 positions shown · non-contrast
Comparison: None.

CLINICAL DATA: Abdominal pain.

EXAM:
OBSTETRIC <14 WK US AND TRANSVAGINAL OB US
TECHNIQUE: Both transabdominal and transvaginal ultrasound examinations were
performed for complete evaluation of the gestation as well as the
maternal uterus, adnexal regions, and pelvic cul-de-sac.
Transvaginal technique was performed to assess early pregnancy.

[Series 1: us ob < 14 weeks - us ob tv · 15 of 55 slices shown]
[im 1/55]
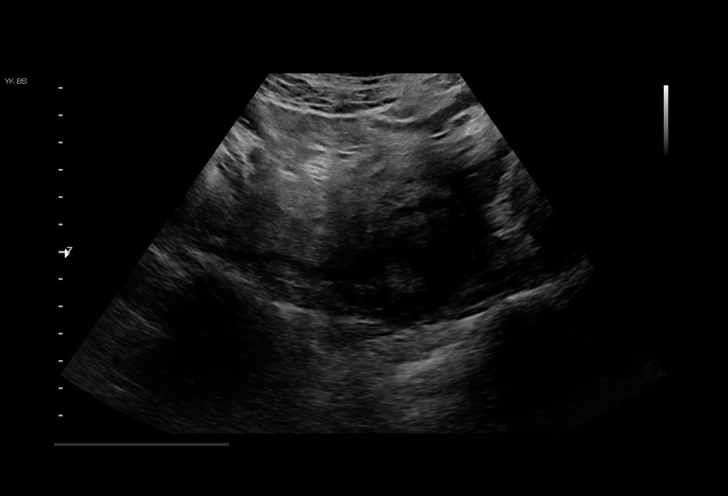
[im 5/55]
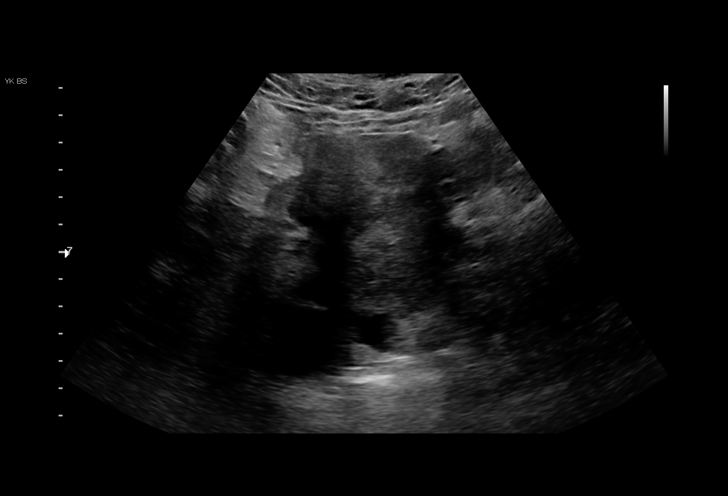
[im 9/55]
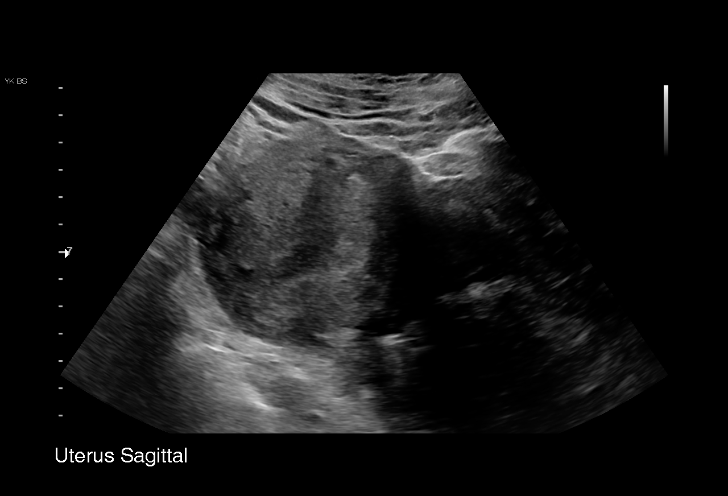
[im 13/55]
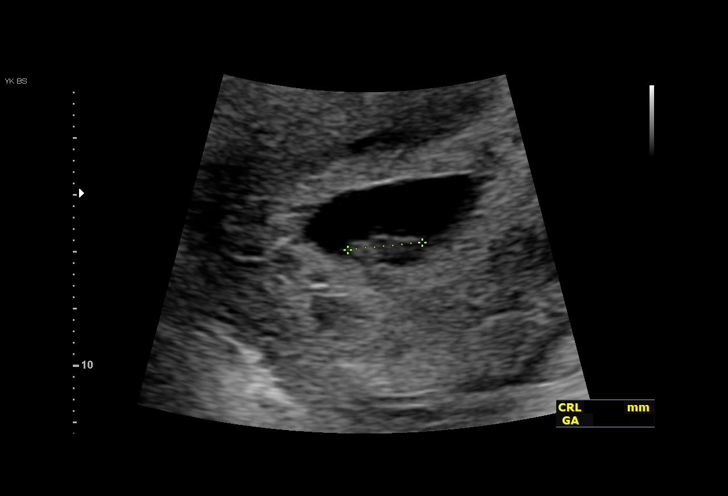
[im 17/55]
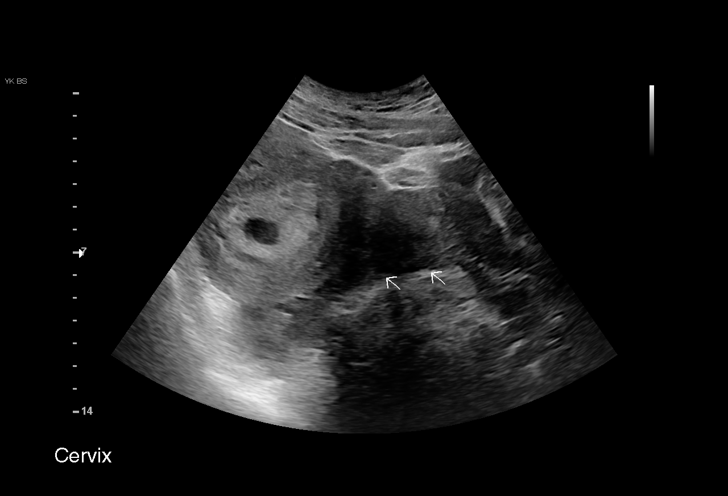
[im 21/55]
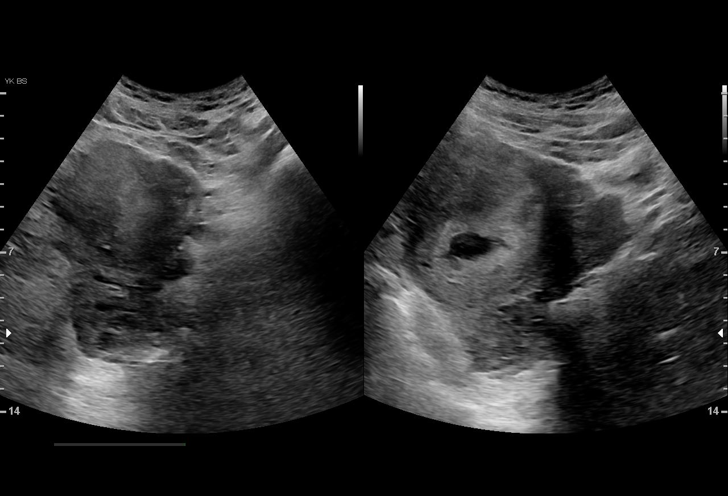
[im 25/55]
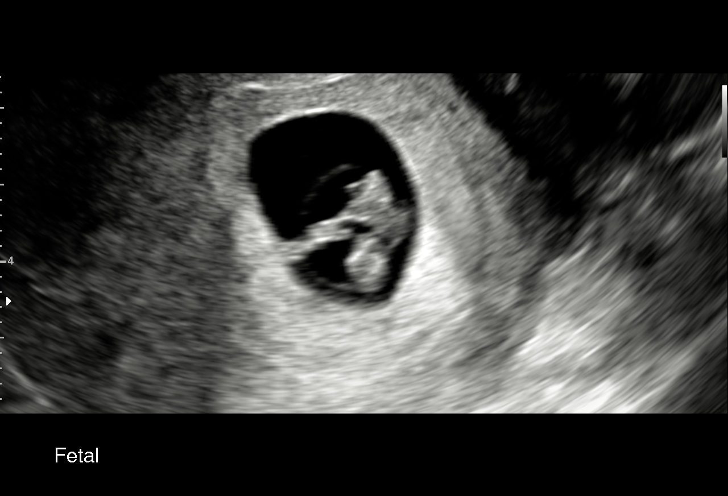
[im 29/55]
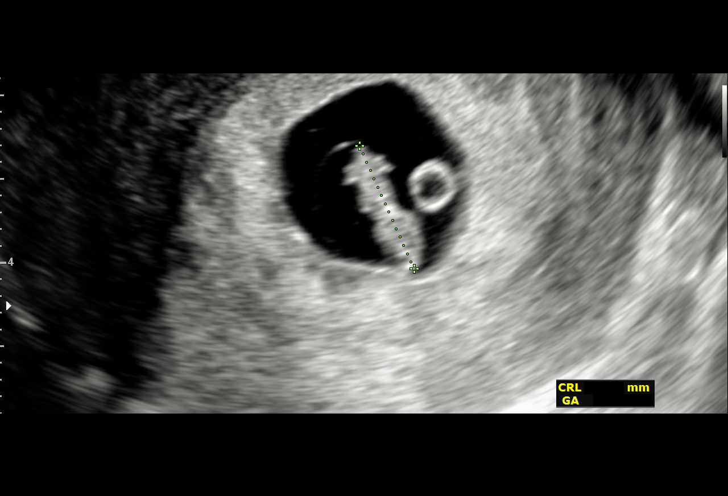
[im 31/55]
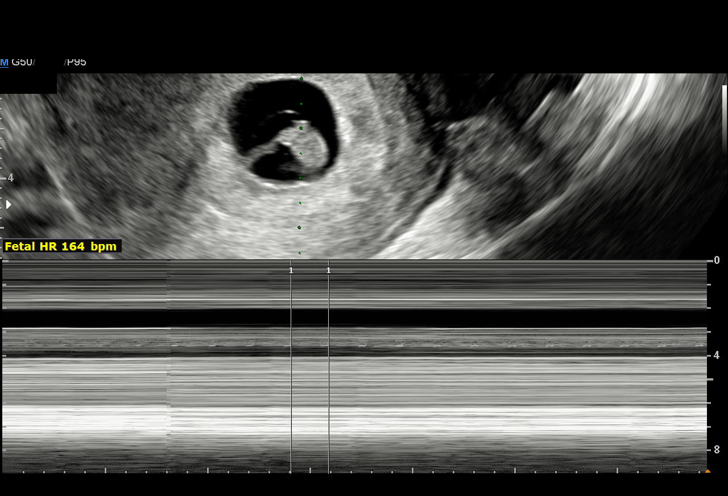
[im 35/55]
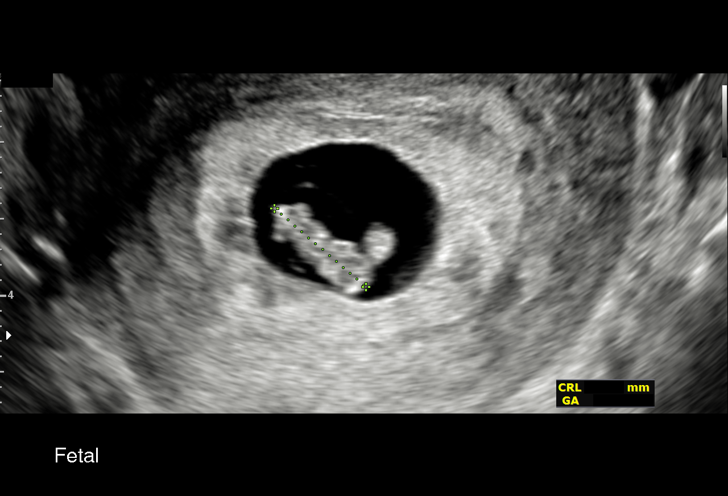
[im 39/55]
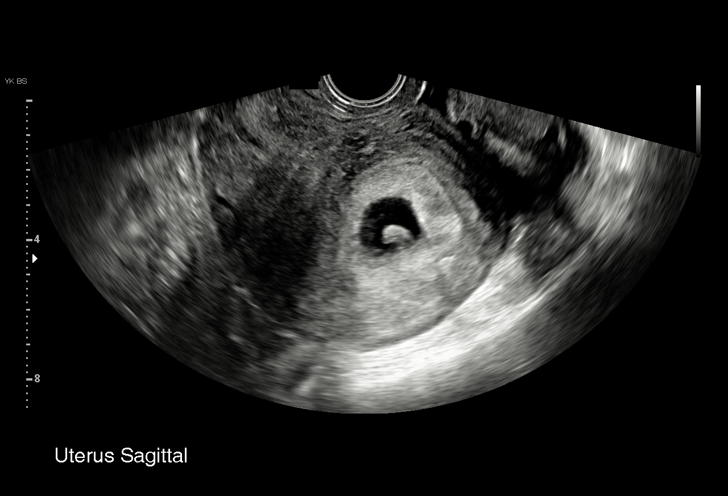
[im 43/55]
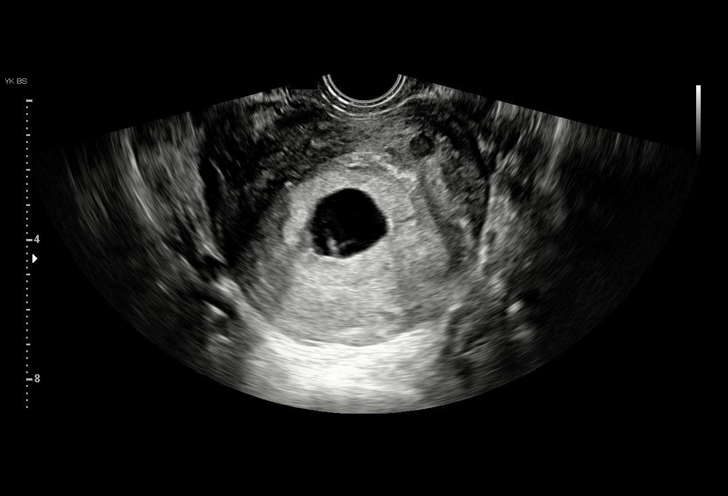
[im 47/55]
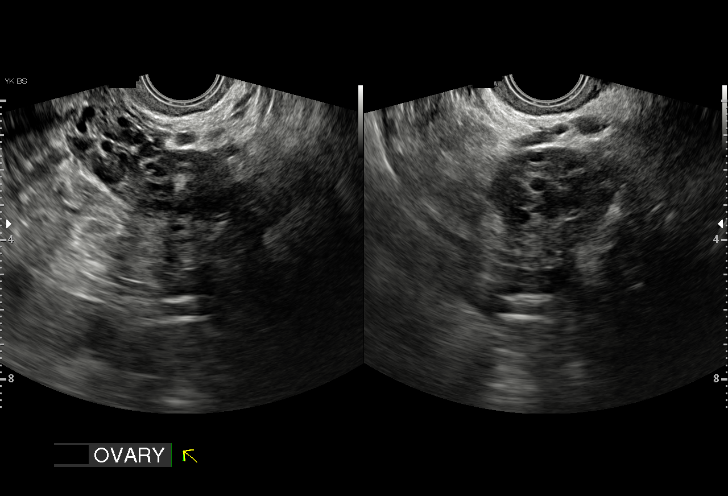
[im 51/55]
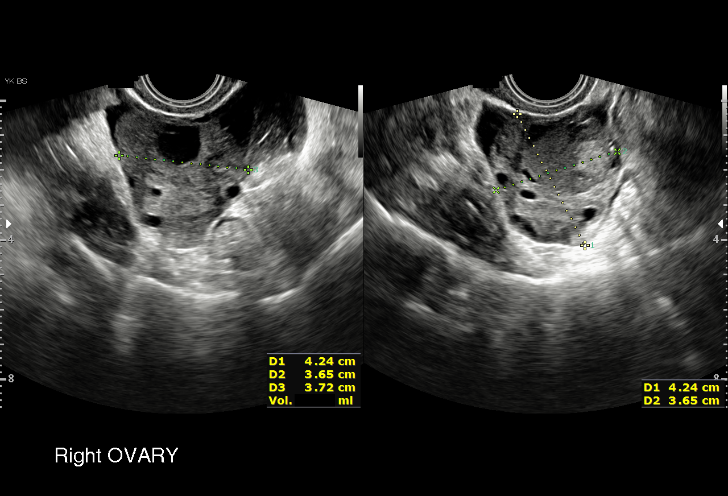
[im 55/55]
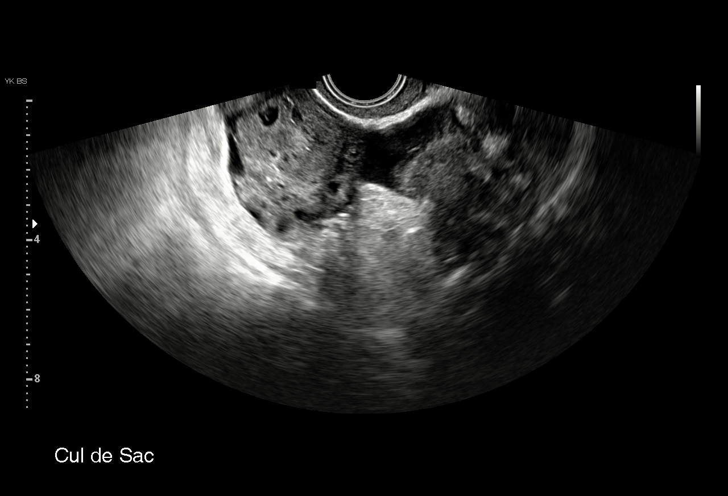

[15 of 28 positions shown; findings below may reference images not displayed]

FINDINGS: Intrauterine gestational sac: Single

Yolk sac:  Visualized.

Embryo:  Visualized.

Cardiac Activity: Visualized.

Heart Rate: 163 bpm

MSD: 22.2 mm   7 w   1 d

CRL:  15.3 mm   7 w   6 d                  US EDC: March 26, 2020

Subchorionic hemorrhage:  None visualized.

Maternal uterus/adnexae:

The right ovary measures 4.2 cm x 3.7 cm x 3.6 cm and is normal in
appearance.

The left ovary measures 3.8 cm x 3.0 cm x 2.3 cm and is normal in
appearance.

A trace amount of pelvic free fluid is seen.
IMPRESSION: Single, viable intrauterine pregnancy at approximately 7 weeks and 6
days gestation by ultrasound evaluation.

## 2022-03-30 IMAGING — US US MFM OB FOLLOW-UP
1 series · 14 of 28 positions shown · non-contrast
Comparison: none

[Series 1: us mfm ob follow-up · 58 acquisitions, 14 frames shown]
[im 3/58]
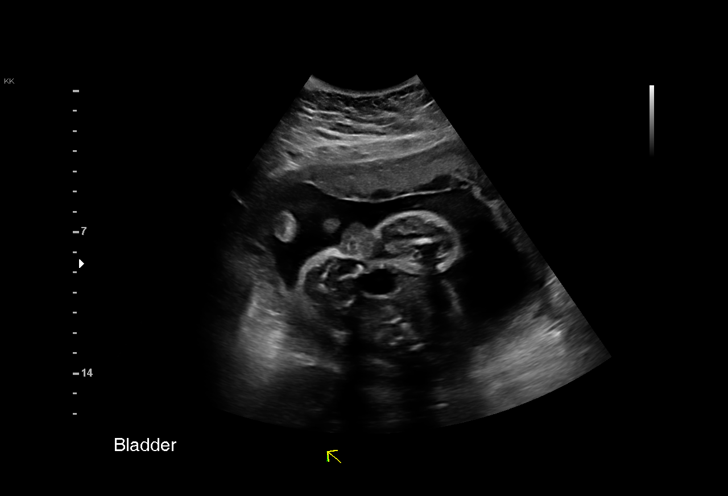
[im 7/58]
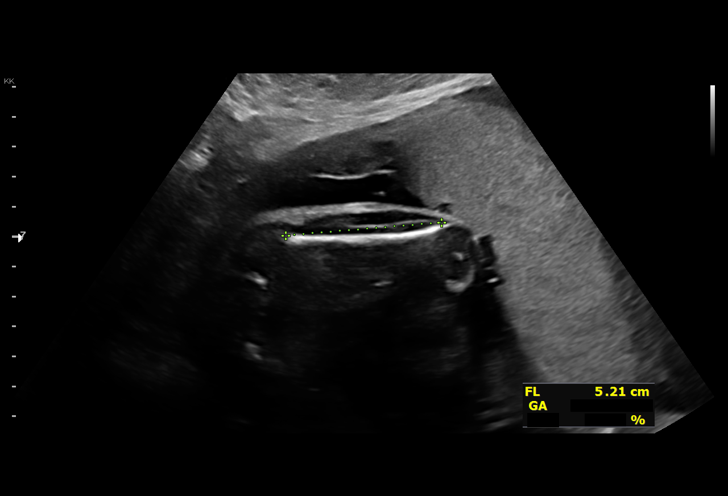
[im 11/58]
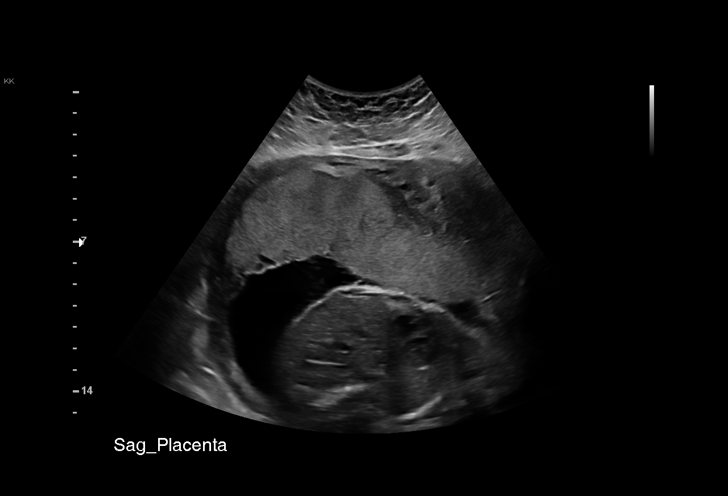
[im 15/58]
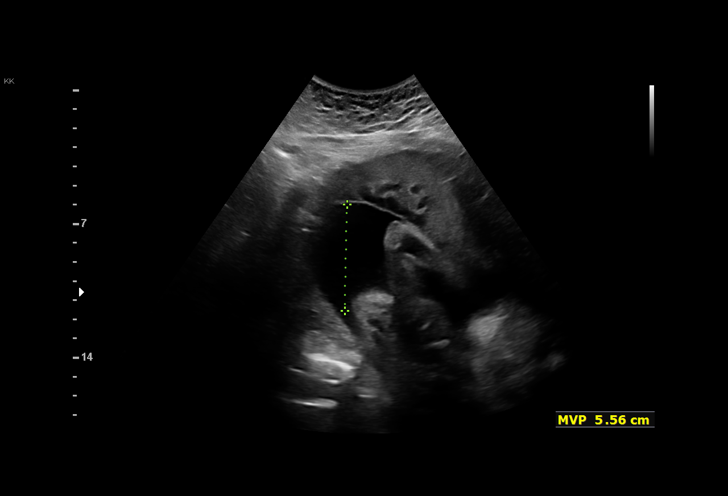
[im 20/58]
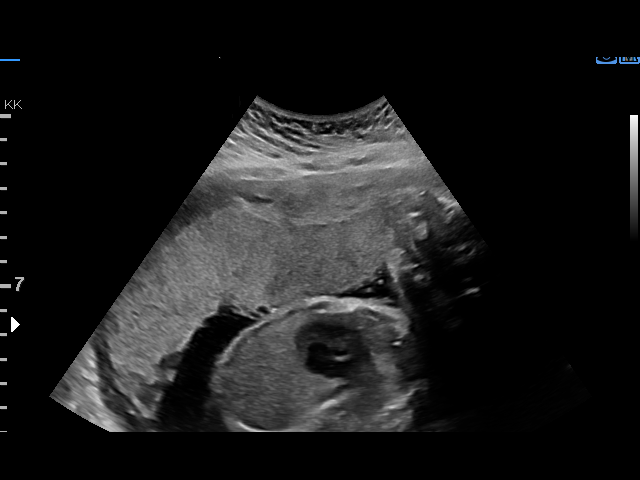
[im 24/58]
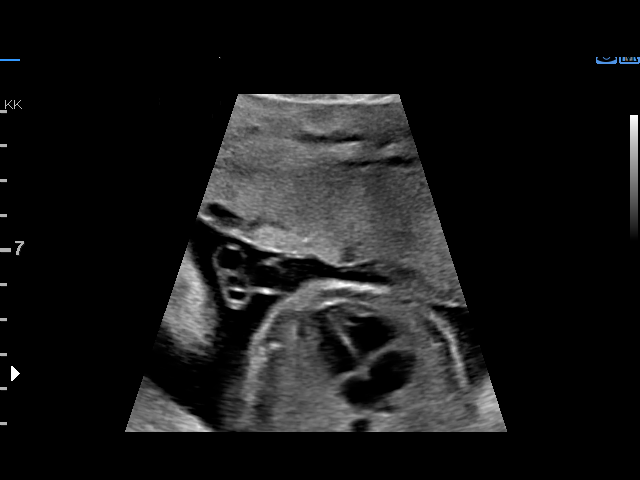
[im 28/58]
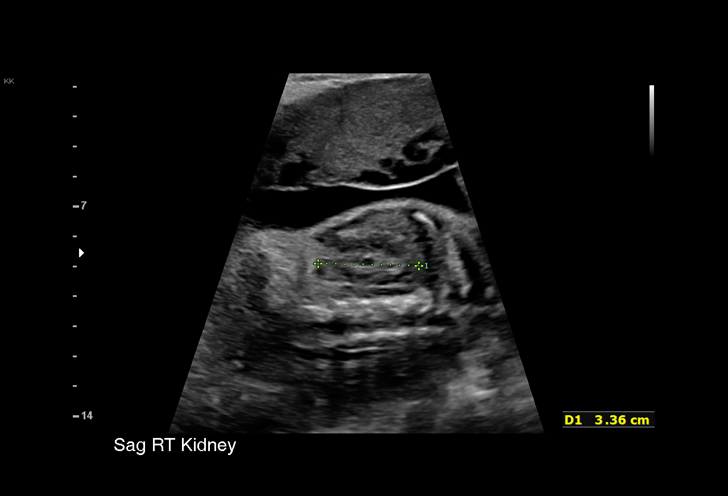
[im 32/58]
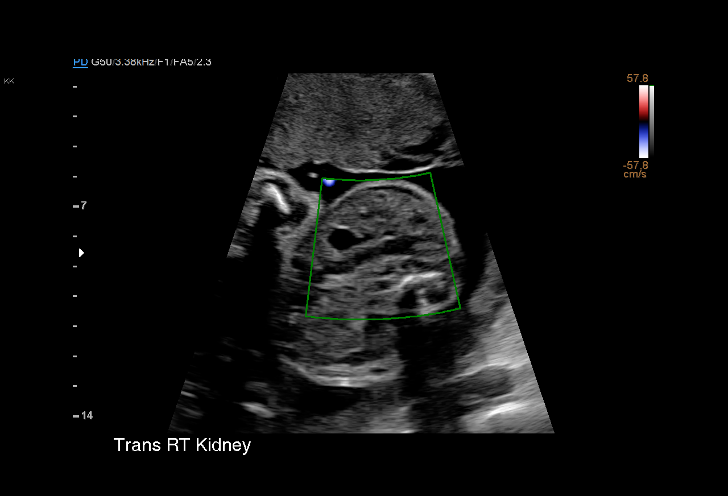
[im 36/58]
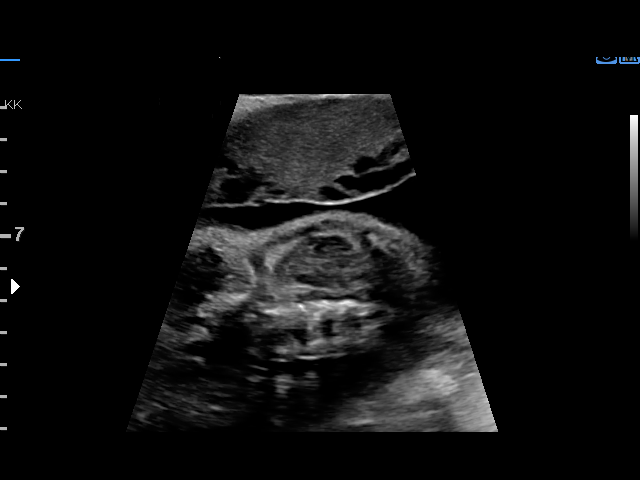
[im 41/58]
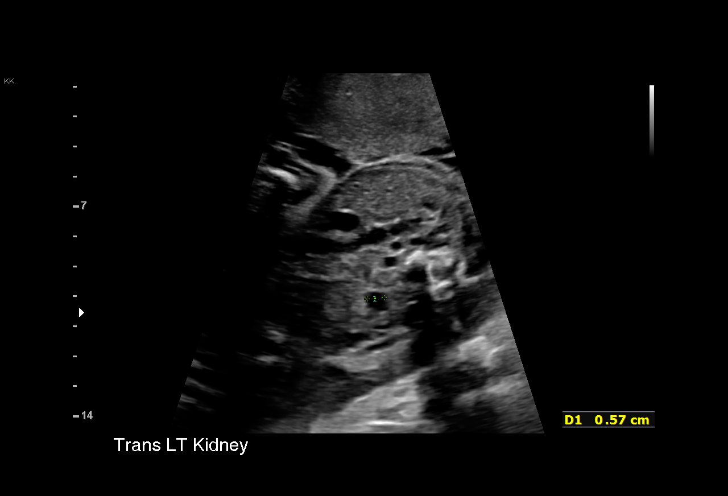
[im 45/58]
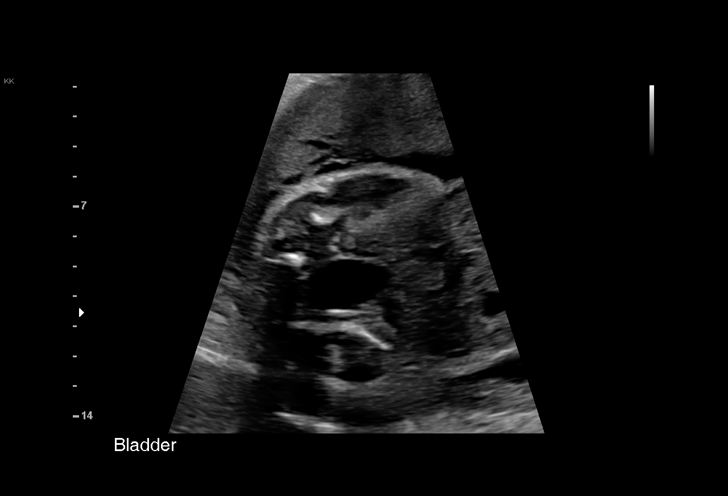
[im 49/58]
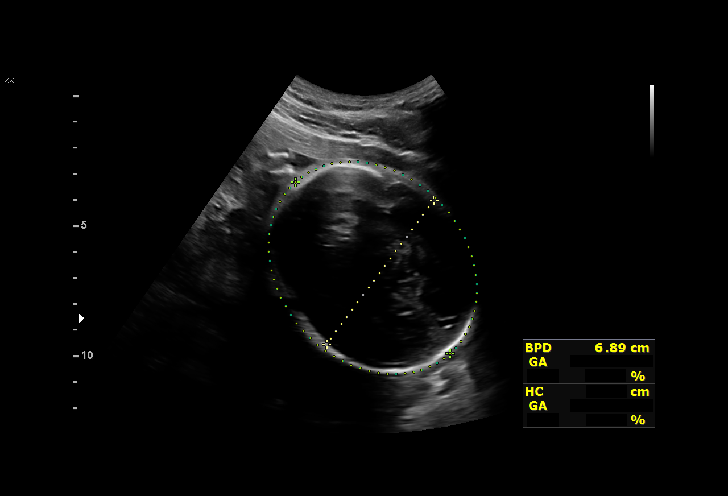
[im 53/58]
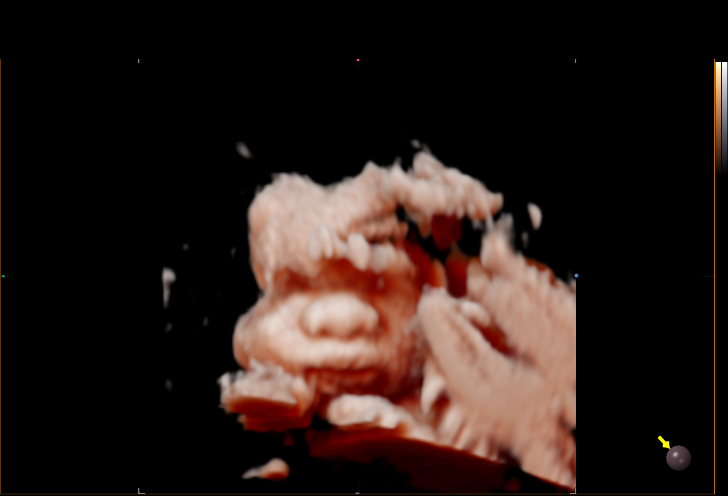
[im 58/58]
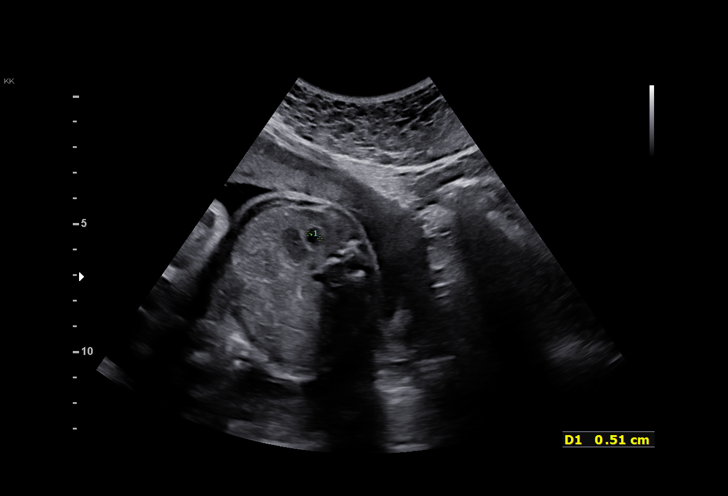

[14 of 28 positions shown; findings below may reference images not displayed]

Indications

 Pyelectasis of fetus on prenatal ultrasound
 Encounter for other antenatal screening
 follow-up
 Echogenic intracardiac focus of the heart
 (EIF)
 Genetic carrier (SMA carrier, declines
 partner testing)
 History of cesarean delivery, currently
 pregnant
 Abnormal biochemical screen (Atypical
 finding on sex chromosomes)
 Obesity complicating pregnancy, second
 trimester (BMI 31)
 27 weeks gestation of pregnancy
Vital Signs

 BMI:
Fetal Evaluation

 Num Of Fetuses:         1
 Fetal Heart Rate(bpm):  147
 Cardiac Activity:       Observed

 Amniotic Fluid
 AFI FV:      Within normal limits

                             Largest Pocket(cm)

Biometry

 BPD:      69.6  mm     G. Age:  28w 0d         57  %    CI:        74.83   %    70 - 86
                                                         FL/HC:      20.3   %    18.6 -
 HC:      255.3  mm     G. Age:  27w 5d         30  %    HC/AC:      1.08        1.05 -
 AC:      236.6  mm     G. Age:  28w 0d         59  %    FL/BPD:     74.3   %    71 - 87
 FL:       51.7  mm     G. Age:  27w 4d         40  %    FL/AC:      21.9   %    20 - 24

 Est. FW:    5577  gm      2 lb 8 oz     54  %
OB History

 Gravidity:    3         Term:   2
 Living:       2
Gestational Age

 U/S Today:     27w 6d                                        EDD:   03/23/20
 Best:          27w 3d     Det. By:  Early Ultrasound         EDD:   03/26/20
                                     (08/14/19)
Anatomy

 Cranium:               Appears normal         Aortic Arch:            Previously seen
 Cavum:                 Previously seen        Ductal Arch:            Previously seen
 Ventricles:            Appears normal         Diaphragm:              Appears normal
 Choroid Plexus:        Previously seen        Stomach:                Appears normal, left
                                                                       sided
 Cerebellum:            Previously seen        Abdomen:                Appears normal
 Posterior Fossa:       Previously seen        Abdominal Wall:         Previously seen
 Nuchal Fold:           Previously seen        Cord Vessels:           Previously seen
 Face:                  Orbits ps; profile not Kidneys:                Left UTD 5.7mm
                        well visualized
 Lips:                  Previously seen        Bladder:                Appears normal
 Thoracic:              Appears normal         Spine:                  Limited views
                                                                       previously seen
 Heart:                 Appears normal; EIF    Upper Extremities:      Previously seen
 RVOT:                  Appears normal         Lower Extremities:      Previously seen
 LVOT:                  Appears normal

 Other:  Fetus appears to be a male. Heels, Nasal bone,and 5th digit
         previously visualized.
Cervix Uterus Adnexa

 Cervix
 Not visualized (advanced GA >71wks)
Comments

 This patient was seen for a follow up growth scan due to mild
 bilateral pyelectasis that was noted during her last ultrasound
 exam.  She denies any problems since her last exam.
 She was informed that the fetal growth and amniotic fluid
 level appears appropriate for her gestational age.
 Mild left pyelectasis measuring 0.5 cm continues to be noted
 today.  The patient was advised that should the renal pelvis
 dilatation remain the same during her future exams, this will
 most likely be a normal variant.
 A follow up exam was scheduled in 5 weeks to assess the
 fetal kidneys and growth.
